# Patient Record
Sex: Female | Born: 1985
Health system: Southern US, Community
[De-identification: ages and names within clinical notes are randomized; demographics above are authoritative.]

## PROBLEM LIST (undated history)

## (undated) ENCOUNTER — Inpatient Hospital Stay (HOSPITAL_COMMUNITY): Payer: Self-pay

## (undated) DIAGNOSIS — G2581 Restless legs syndrome: Secondary | ICD-10-CM

## (undated) DIAGNOSIS — K219 Gastro-esophageal reflux disease without esophagitis: Secondary | ICD-10-CM

## (undated) DIAGNOSIS — T7840XA Allergy, unspecified, initial encounter: Secondary | ICD-10-CM

## (undated) DIAGNOSIS — N189 Chronic kidney disease, unspecified: Secondary | ICD-10-CM

## (undated) DIAGNOSIS — F419 Anxiety disorder, unspecified: Secondary | ICD-10-CM

## (undated) DIAGNOSIS — M25569 Pain in unspecified knee: Secondary | ICD-10-CM

## (undated) DIAGNOSIS — M549 Dorsalgia, unspecified: Secondary | ICD-10-CM

## (undated) DIAGNOSIS — I8390 Asymptomatic varicose veins of unspecified lower extremity: Secondary | ICD-10-CM

## (undated) DIAGNOSIS — M1990 Unspecified osteoarthritis, unspecified site: Secondary | ICD-10-CM

## (undated) DIAGNOSIS — O169 Unspecified maternal hypertension, unspecified trimester: Secondary | ICD-10-CM

## (undated) DIAGNOSIS — E669 Obesity, unspecified: Secondary | ICD-10-CM

## (undated) DIAGNOSIS — R600 Localized edema: Secondary | ICD-10-CM

## (undated) HISTORY — DX: Restless legs syndrome: G25.81

## (undated) HISTORY — DX: Localized edema: R60.0

## (undated) HISTORY — PX: ADENOIDECTOMY: SUR15

## (undated) HISTORY — DX: Obesity, unspecified: E66.9

## (undated) HISTORY — PX: TONSILLECTOMY: SUR1361

## (undated) HISTORY — DX: Gastro-esophageal reflux disease without esophagitis: K21.9

## (undated) HISTORY — DX: Unspecified maternal hypertension, unspecified trimester: O16.9

## (undated) HISTORY — DX: Asymptomatic varicose veins of unspecified lower extremity: I83.90

## (undated) HISTORY — PX: KNEE ARTHROSCOPY: SUR90

## (undated) HISTORY — DX: Pain in unspecified knee: M25.569

## (undated) HISTORY — DX: Anxiety disorder, unspecified: F41.9

## (undated) HISTORY — DX: Allergy, unspecified, initial encounter: T78.40XA

## (undated) HISTORY — DX: Chronic kidney disease, unspecified: N18.9

## (undated) HISTORY — DX: Dorsalgia, unspecified: M54.9

## (undated) HISTORY — PX: MIDDLE EAR SURGERY: SHX713

---
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
## 2005-01-29 ENCOUNTER — Other Ambulatory Visit: Admission: RE | Admit: 2005-01-29 | Discharge: 2005-01-29 | Payer: Self-pay | Admitting: Obstetrics & Gynecology

## 2005-05-21 ENCOUNTER — Ambulatory Visit (HOSPITAL_BASED_OUTPATIENT_CLINIC_OR_DEPARTMENT_OTHER): Admission: RE | Admit: 2005-05-21 | Discharge: 2005-05-21 | Payer: Self-pay | Admitting: Specialist

## 2005-06-26 ENCOUNTER — Inpatient Hospital Stay (HOSPITAL_COMMUNITY): Admission: AD | Admit: 2005-06-26 | Discharge: 2005-06-28 | Payer: Self-pay | Admitting: Family Medicine

## 2005-11-10 ENCOUNTER — Emergency Department (HOSPITAL_COMMUNITY): Admission: EM | Admit: 2005-11-10 | Discharge: 2005-11-11 | Payer: Self-pay | Admitting: Emergency Medicine

## 2005-12-09 ENCOUNTER — Ambulatory Visit (HOSPITAL_COMMUNITY): Admission: RE | Admit: 2005-12-09 | Discharge: 2005-12-09 | Payer: Self-pay | Admitting: Otolaryngology

## 2006-02-27 ENCOUNTER — Encounter (HOSPITAL_COMMUNITY): Admission: RE | Admit: 2006-02-27 | Discharge: 2006-03-29 | Payer: Self-pay | Admitting: Endocrinology

## 2006-06-05 ENCOUNTER — Ambulatory Visit (HOSPITAL_COMMUNITY): Admission: RE | Admit: 2006-06-05 | Discharge: 2006-06-05 | Payer: Self-pay | Admitting: Urology

## 2008-07-09 ENCOUNTER — Emergency Department (HOSPITAL_COMMUNITY): Admission: EM | Admit: 2008-07-09 | Discharge: 2008-07-09 | Payer: Self-pay | Admitting: Emergency Medicine

## 2010-06-03 NOTE — L&D Delivery Note (Signed)
Delivery Note At 3:29 PM a viable female was delivered via Vaginal, Spontaneous Delivery (Presentation: ; Occiput Anterior).  APGAR: 9, 9; weight 7 lb 10.1 oz (3460 g).   Placenta status: Intact, Spontaneous.  Cord: 3 vessels with the following complications: None.  Cord pH: not obtained  Anesthesia: Epidural  Episiotomy: none Lacerations: tear of vaginal septum and second degree laceration - septum  Suture Repair: 2.0 3.0 chromic vicryl Est. Blood Loss (mL):   Mom to postpartum.  Baby to nursery-stable.  Elizabeth Foster L 05/09/2011, 3:50 PM

## 2010-10-09 LAB — ABO/RH: RH Type: POSITIVE

## 2010-10-09 LAB — ANTIBODY SCREEN: Antibody Screen: NEGATIVE

## 2010-10-09 LAB — RUBELLA ANTIBODY, IGM: Rubella: IMMUNE

## 2010-10-09 LAB — HIV ANTIBODY (ROUTINE TESTING W REFLEX): HIV: NONREACTIVE

## 2010-10-09 LAB — HEPATITIS B SURFACE ANTIGEN: Hepatitis B Surface Ag: NEGATIVE

## 2010-10-14 ENCOUNTER — Inpatient Hospital Stay (HOSPITAL_COMMUNITY): Payer: 59

## 2010-10-14 ENCOUNTER — Inpatient Hospital Stay (HOSPITAL_COMMUNITY)
Admission: AD | Admit: 2010-10-14 | Discharge: 2010-10-14 | Disposition: A | Discharge status: Home / Self Care | Payer: 59 | Source: Ambulatory Visit | Attending: Obstetrics and Gynecology | Admitting: Obstetrics and Gynecology

## 2010-10-14 DIAGNOSIS — O200 Threatened abortion: Secondary | ICD-10-CM

## 2010-10-14 LAB — URINE MICROSCOPIC-ADD ON

## 2010-10-14 LAB — ABO/RH: ABO/RH(D): A POS

## 2010-10-14 LAB — URINALYSIS, ROUTINE W REFLEX MICROSCOPIC
Glucose, UA: NEGATIVE mg/dL
Ketones, ur: NEGATIVE mg/dL
Protein, ur: NEGATIVE mg/dL
Specific Gravity, Urine: 1.01 (ref 1.005–1.030)
pH: 5.5 (ref 5.0–8.0)

## 2010-10-14 LAB — CBC
Hemoglobin: 13.7 g/dL (ref 12.0–15.0)
MCHC: 35.7 g/dL (ref 30.0–36.0)
MCV: 87.3 fL (ref 78.0–100.0)

## 2010-10-14 LAB — HCG, QUANTITATIVE, PREGNANCY: hCG, Beta Chain, Quant, S: 96262 m[IU]/mL — ABNORMAL HIGH (ref <5)

## 2010-10-15 LAB — GC/CHLAMYDIA PROBE AMP, GENITAL: GC Probe Amp, Genital: NEGATIVE

## 2010-10-19 NOTE — Op Note (Signed)
Elizabeth, Foster           ACCOUNT NO.:  [id number]   MEDICAL RECORD NO.:  [id number]          PATIENT TYPE:  AMB   LOCATION:  DSC                          FACILITY:  [facility]   PHYSICIAN:  Erasmo Leventhal, M.D.DATE OF BIRTH:  Feb 10, 1986   DATE OF PROCEDURE:  05/21/2005  DATE OF DISCHARGE:                                 OPERATIVE REPORT   PREOPERATIVE DIAGNOSIS:  Right knee probable recurrent lateral meniscal  tear.   POSTOPERATIVE DIAGNOSES:  1.  Right knee probable recurrent lateral meniscal tear.  2.  Synovitis.  3.  Grade 2 chondromalacia of the patella.   PROCEDURE:  1.  Right knee arthroscopic partial lateral meniscectomy.  2.  Chondroplasty of patella.  3.  Synovectomy.   SURGEON:  Erasmo Leventhal, M.D.   ASSISTANT:  Jaquelyn Bitter. Chabon, P.A.-C   ANESTHESIA:  General followed by intraoperative knee block.   ESTIMATED BLOOD LOSS:  Less than 5 mL.   DRAINS:  None.   COMPLICATIONS:  None.   TOURNIQUET TIME:  25 minutes at 300 mmHg.   DISPOSITION:  To PACU stable.   OPERATIVE DETAILS:  The patient's family was counseled in the holding area.  Patient was identified, IV was started and antibiotics were given.  The  patient refused a knee block at this time.  He was taken to the OR room,  placed in usual position, under general anesthesia.  The right lower  extremity was placed in a thigh holder, prepped with DuraPrep and draped in  a sterile fashion and exsanguinated with Esmarch inflated to 300 mmHg.  Arthroscopic portals were established:  Proximal medial, anteromedial, and  anterolateral.  Diagnostic arthroscopy was then undertaken.   She had undergone a previous lateral release and patellofemoral tracking was  normal, but there was some grade 2 chondromalacia in the fossa on the  undersurface of the patella with some unstable flaps and mechanical  chondroplasty was performed about the stable base.  The __________  was  unremarkable.  ACL and  PCL were intact.  Medial gutter was unremarkable.  Medial compartment was inspected with normal articular cartilage.  Medial  meniscus lateral side was inspected with normal articular cartilage.  She  had undergone partial medial meniscectomy in the past for discoid lateral  meniscus.  I probed posteriorly to make sure that she did not have a type 3  or unstable lateral meniscus, and she did not, but there were radial tears  on the inner one-third.  Utilizing the basket a very small amount of lateral  meniscal tissue was smoothed back and saucerized lateral meniscus to a nice  smooth edge and then took arthro __________ specimen and gently coagulated  the peripheral meniscus to stabilize it being very careful to touch the  meniscus only, and not the articular cartilage.  There was also some  synovitis in the intercondylar notch which appeared to be like it was  impinging on the lateral compartment.  It was shaved down and electrocautery  was used to coagulate the periphery and also for good hemostasis.   The knee was then sequentially reinspected.  There were no  other  abnormalities noted.  The knee was irrigated and all instruments were  removed.  The three portals were closed with 4-0 Nylon suture.  For further  anesthesia 30 mL of 1/2% Marcaine with epinephrine with an additional 4 mg  of morphine sulfate was injected into the knee for postoperative pain, and  hemostasis as a knee block.  Sterile dressing was applied to the knee.  Tourniquet was deflated, normal circulation in the foot and ankle at the end  of the case.  TED hose and ice pack was applied in full extension. She  tolerated the procedure well.  There were no complications, and she was  gently taken to the recovery room in stable condition.  Sponge and needle  counts were correct.  No complications or problems.           ______________________________  Erasmo Leventhal, M.D.     RAC/MEDQ  D:  05/21/2005  T:   05/22/2005  Job:  [id number]   cc:   [facility]

## 2010-10-19 NOTE — H&P (Signed)
Elizabeth Foster, Elizabeth Foster           ACCOUNT NO.:  [id number]   MEDICAL RECORD NO.:  [id number]          PATIENT TYPE:  INP   LOCATION:  A321                          FACILITY:  [facility]   PHYSICIAN:  Scott A. Gerda Diss, MD    DATE OF BIRTH:  [birth year]   DATE OF ADMISSION:  06/26/2005  DATE OF DISCHARGE:  LH                                HISTORY & PHYSICAL   CHIEF COMPLAINT:  Right flank pain discomfort, abdominal pain, dysuria.   HISTORY OF PRESENT ILLNESS:  A 25 year old white female who was seen just  yesterday with dysuria off and on for a month, lower abdominal discomfort, a  little bit of chills but no high fevers, no vomiting, was treated with a  shot of Rocephin and was instructed to follow up on January 24.  No  vomiting.  Was unable to take much in because of nausea.  Increased pain  that woke her up much of the time at night.  The patient denied any  hematuria, denied diarrhea.  No respiratory symptoms.   PAST MEDICAL HISTORY:  The patient has normal overall health with a history  of gastritis and allergies and is on birth control pills.  She has had  recent knee surgery.  She has a history of reflux as well.   FAMILY HISTORY:  Noncontributory.   PHYSICAL EXAMINATION:  GENERAL: No acute distress.  HEENT:  TMs within normal limits.  NECK: Trach within normal limits.  CHEST: Within normal limits.  HEART: Regular.  ABDOMEN:  Mild right percussion tenderness and abdominal tenderness.  No  guarding or rebound.   MEDICATIONS:  1.  Protonix 40 mg daily.  2.  Allegra 60 mg twice daily.  3.  Yasmin daily.   LABORATORY DATA:  White count 9.8, hemoglobin 13.8, hematocrit 30.5.  Sodium  138, potassium 3.3, glucose 103, BUN 5, creatinine 0.8.  ALT and AST normal.  Beta HCG less than 2.   CT scan pending.   ASSESSMENT AND PLAN:  Pyelonephritis: Because of the ongoing course over the  past several weeks, we will go ahead with CT scan to make sure there is not  something else going  on.  We will cover her with Levaquin and Rocephin and  monitor the patient closely.  Expect the patient to gradually improve,  possibly be able to be discharged within the next few days, but that all  depends on outcome of these tests and how she responds.      Scott A. Gerda Diss, MD  Electronically Signed     SAL/MEDQ  D:  06/26/2005  T:  06/26/2005  Job:  [id number]

## 2010-10-19 NOTE — Discharge Summary (Signed)
NAMERYLEN, HOU           ACCOUNT NO.:  [id number]   MEDICAL RECORD NO.:  [id number]          PATIENT TYPE:  INP   LOCATION:  A321                          FACILITY:  [facility]   PHYSICIAN:  Scott A. Gerda Diss, MD    DATE OF BIRTH:  [birth year]   DATE OF ADMISSION:  06/26/2005  DATE OF DISCHARGE:  01/26/2007LH                                 DISCHARGE SUMMARY   DISCHARGE DIAGNOSIS:  Pyelonephritis.   HOSPITAL COURSE:  This patient was admitted in after failing outpatient  therapy with antibiotics.  She was having right flank pain discomfort,  abdominal pain with dysuria.  We did a CT scan while in the hospital, did  not have an abscess.  The patient responded to the IV antibiotics, fluids  and pain medication and gradually over the next couple of days had less pain  and discomfort.  On the evening of January 25, was much improved.  In the  morning, we felt she was stable enough to be sent home.  She was sent home  on Levaquin, fluid intake and instructed to follow up in 4-5 days after  hospitalization.  Her main aspects after being admitted into the hospital  were severe pain discomfort despite outpatient treatment along with nausea  and vomiting.  It should be noted that her white count on admission was 9.8,  but clinically, the patient was in severe pain, inability to keep things  down, had potassium of 3.3.  It was felt that the wiser thing to do was to  treat her with IV fluids and antibiotics.      Scott A. Gerda Diss, MD  Electronically Signed     SAL/MEDQ  D:  07/23/2005  T:  07/23/2005  Job:  [id number]

## 2011-03-23 ENCOUNTER — Inpatient Hospital Stay (HOSPITAL_COMMUNITY)
Admission: AD | Admit: 2011-03-23 | Discharge: 2011-03-23 | Disposition: A | Discharge status: Home / Self Care | Payer: 59 | Source: Ambulatory Visit | Attending: Obstetrics and Gynecology | Admitting: Obstetrics and Gynecology

## 2011-03-23 ENCOUNTER — Encounter (HOSPITAL_COMMUNITY): Payer: Self-pay

## 2011-03-23 DIAGNOSIS — R109 Unspecified abdominal pain: Secondary | ICD-10-CM | POA: Insufficient documentation

## 2011-03-23 DIAGNOSIS — O99891 Other specified diseases and conditions complicating pregnancy: Secondary | ICD-10-CM | POA: Insufficient documentation

## 2011-03-23 DIAGNOSIS — N949 Unspecified condition associated with female genital organs and menstrual cycle: Secondary | ICD-10-CM

## 2011-03-23 LAB — URINE MICROSCOPIC-ADD ON

## 2011-03-23 LAB — URINALYSIS, ROUTINE W REFLEX MICROSCOPIC
Ketones, ur: NEGATIVE mg/dL
Nitrite: NEGATIVE
Urobilinogen, UA: 0.2 mg/dL (ref 0.0–1.0)

## 2011-03-23 NOTE — Progress Notes (Signed)
Onset of cramping since 2:00 p.m. With some tightening with back pain, had SCH at 10 weeks.

## 2011-03-23 NOTE — ED Provider Notes (Signed)
Elizabeth Foster 89 y.o.G1P0 @[redacted]w[redacted]d  Chief Complaint  Patient presents with  . Abdominal Cramping    SUBJECTIVE  HPI:  She reports intermittent upper abdominal pain which she thought were Elizabeth Foster beginning at 2 PM today. She describes it as "balling up"  Bu tnot does not attribute it to fetal movement .She's not having upper abdominal pain now. She also has been having continuous lower abdominal cramping which is now somewhat better at rest but still present. The cramping radiates to low back. Good fetal movement. No leakage of fluid or vaginal bleeding. Denies dysuria, hematuria, changing frequency of urination, urgency, fever/chills. Denies any abnormal discharge, irritation or itching of vulva or vagina. 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 She notes the discomfort is a little worse with walking.  History reviewed. No pertinent past medical history.   Gyn Hx: negative for STI's Past Surgical History  Procedure Date  . Knee arthroscopy     x2  . Tonsillectomy   . Adenoidectomy    History   Social History  . Marital Status: Married    Spouse Name: N/A    Number of Children: N/A  . Years of Education: N/A   Occupational History  . Not on file.   Social History Main Topics  . Smoking status: Never Smoker   . Smokeless tobacco: Not on file  . Alcohol Use: No  . Drug Use: No  . Sexually Active: Yes   Other Topics Concern  . Not on file   Social History Narrative  . No narrative on file   No current facility-administered medications on file prior to encounter.   No current outpatient prescriptions on file prior to encounter.   Allergies  Allergen Reactions  . Amoxicillin Rash    ROS: Pertinent items in HPI  OBJECTIVE  BP 124/78  Pulse 79  Temp(Src) 98.8 F (37.1 C) (Oral)  Resp 18  Ht 5\' 4"  (1.626 m)  Wt 91.354 kg (201 lb 6.4 oz)  BMI 34.57 kg/m2   Physical Exam:  General: WN/WD in NAD JXB:JYNW, nontender, size consistent with dates.  No  palpable UCs. VE: there is a vaginal septum. Discharge is physiologic. Cervix is long closed high. Back: neg CVAT Ext: neg edema  Toco: No UCs, occ sl UI rare low amplitude UC on 1 hour of EFM FHR 135 reactive  Results for orders placed during the hospital encounter of 03/23/11 (from the past 24 hour(s))  URINALYSIS, ROUTINE W REFLEX MICROSCOPIC     Status: Abnormal   Collection Time   03/23/11  6:15 PM      Component Value Range   Color, Urine YELLOW  YELLOW    Appearance CLEAR  CLEAR    Specific Gravity, Urine <1.005 (*) 1.005 - 1.030    pH 6.0  5.0 - 8.0    Glucose, UA NEGATIVE  NEGATIVE (mg/dL)   Hgb urine dipstick NEGATIVE  NEGATIVE    Bilirubin Urine NEGATIVE  NEGATIVE    Ketones, ur NEGATIVE  NEGATIVE (mg/dL)   Protein, ur NEGATIVE  NEGATIVE (mg/dL)   Urobilinogen, UA 0.2  0.0 - 1.0 (mg/dL)   Nitrite NEGATIVE  NEGATIVE    Leukocytes, UA SMALL (*) NEGATIVE   URINE MICROSCOPIC-ADD ON     Status: Abnormal   Collection Time   03/23/11  6:15 PM      Component Value Range   Squamous Epithelial / LPF FEW (*) RARE    WBC, UA 0-2  <3 (WBC/hpf)  RBC / HPF 0-2  <3 (RBC/hpf)   Bacteria, UA RARE  RARE    ASSESSMENT  G1 at 33 wks with abd discomfort without evidence for PTL , UTI or dehydration. Suspect musculoskeletal origin.   PLAN C/W Dr. Rana Snare. Work excusex 2 days and call office Mon for recheck if still symptomatic. Round ligament pain handout reviewed May use Tylenol

## 2011-04-16 LAB — STREP B DNA PROBE: GBS: NEGATIVE

## 2011-05-07 ENCOUNTER — Telehealth (HOSPITAL_COMMUNITY): Payer: Self-pay | Admitting: *Deleted

## 2011-05-07 ENCOUNTER — Encounter (HOSPITAL_COMMUNITY): Payer: Self-pay | Admitting: *Deleted

## 2011-05-08 ENCOUNTER — Encounter (HOSPITAL_COMMUNITY): Payer: Self-pay | Admitting: *Deleted

## 2011-05-08 ENCOUNTER — Telehealth (HOSPITAL_COMMUNITY): Payer: Self-pay | Admitting: *Deleted

## 2011-05-08 NOTE — Telephone Encounter (Signed)
Preadmission screen  

## 2011-05-09 ENCOUNTER — Encounter (HOSPITAL_COMMUNITY): Payer: Self-pay

## 2011-05-09 ENCOUNTER — Encounter (HOSPITAL_COMMUNITY): Payer: Self-pay | Admitting: Anesthesiology

## 2011-05-09 ENCOUNTER — Inpatient Hospital Stay (HOSPITAL_COMMUNITY): Payer: 59 | Admitting: Anesthesiology

## 2011-05-09 ENCOUNTER — Inpatient Hospital Stay (HOSPITAL_COMMUNITY)
Admission: RE | Admit: 2011-05-09 | Discharge: 2011-05-11 | DRG: 775 | Disposition: A | Discharge status: Home / Self Care | Payer: 59 | Source: Ambulatory Visit | Attending: Obstetrics and Gynecology | Admitting: Obstetrics and Gynecology

## 2011-05-09 DIAGNOSIS — O3460 Maternal care for abnormality of vagina, unspecified trimester: Secondary | ICD-10-CM | POA: Diagnosis present

## 2011-05-09 DIAGNOSIS — Q52129 Other and unspecified longitudinal vaginal septum: Secondary | ICD-10-CM

## 2011-05-09 DIAGNOSIS — O3660X0 Maternal care for excessive fetal growth, unspecified trimester, not applicable or unspecified: Secondary | ICD-10-CM | POA: Diagnosis present

## 2011-05-09 LAB — CBC
Hemoglobin: 13.5 g/dL (ref 12.0–15.0)
Platelets: 169 10*3/uL (ref 150–400)
RBC: 4.41 MIL/uL (ref 3.87–5.11)
WBC: 15 10*3/uL — ABNORMAL HIGH (ref 4.0–10.5)

## 2011-05-09 LAB — RPR: RPR Ser Ql: NONREACTIVE

## 2011-05-09 LAB — RUBELLA SCREEN: Rubella: 68.5 IU/mL — ABNORMAL HIGH

## 2011-05-09 MED ORDER — ZOLPIDEM TARTRATE 5 MG PO TABS: 5.0000 mg | ORAL_TABLET | Freq: Every evening | ORAL | Status: DC | PRN

## 2011-05-09 MED ORDER — LANOLIN HYDROUS EX OINT: TOPICAL_OINTMENT | CUTANEOUS | Status: DC | PRN

## 2011-05-09 MED ORDER — LIDOCAINE HCL 1.5 % IJ SOLN
INTRAMUSCULAR | Status: DC | PRN
  Administered 2011-05-09 (×2): 4 mL via EPIDURAL

## 2011-05-09 MED ORDER — TETANUS-DIPHTH-ACELL PERTUSSIS 5-2.5-18.5 LF-MCG/0.5 IM SUSP: 0.5000 mL | Freq: Once | INTRAMUSCULAR | Status: DC

## 2011-05-09 MED ORDER — BENZOCAINE-MENTHOL 20-0.5 % EX AERO
1.0000 "application " | INHALATION_SPRAY | CUTANEOUS | Status: DC | PRN
  Administered 2011-05-09 – 2011-05-10 (×2): 1 via TOPICAL

## 2011-05-09 MED ORDER — LACTATED RINGERS IV SOLN
INTRAVENOUS | Status: DC
  Administered 2011-05-09 (×2): via INTRAVENOUS

## 2011-05-09 MED ORDER — LACTATED RINGERS IV SOLN: 500.0000 mL | Freq: Once | INTRAVENOUS | Status: DC

## 2011-05-09 MED ORDER — EPHEDRINE 5 MG/ML INJ
10.0000 mg | INTRAVENOUS | Status: DC | PRN
  Filled 2011-05-09: qty 4

## 2011-05-09 MED ORDER — IBUPROFEN 600 MG PO TABS: 600.0000 mg | ORAL_TABLET | Freq: Four times a day (QID) | ORAL | Status: DC | PRN

## 2011-05-09 MED ORDER — SIMETHICONE 80 MG PO CHEW: 80.0000 mg | CHEWABLE_TABLET | ORAL | Status: DC | PRN

## 2011-05-09 MED ORDER — TERBUTALINE SULFATE 1 MG/ML IJ SOLN: 0.2500 mg | Freq: Once | INTRAMUSCULAR | Status: DC | PRN

## 2011-05-09 MED ORDER — PHENYLEPHRINE 40 MCG/ML (10ML) SYRINGE FOR IV PUSH (FOR BLOOD PRESSURE SUPPORT): 80.0000 ug | PREFILLED_SYRINGE | INTRAVENOUS | Status: DC | PRN

## 2011-05-09 MED ORDER — ACETAMINOPHEN 325 MG PO TABS: 650.0000 mg | ORAL_TABLET | ORAL | Status: DC | PRN

## 2011-05-09 MED ORDER — MEASLES, MUMPS & RUBELLA VAC ~~LOC~~ INJ: 0.5000 mL | INJECTION | Freq: Once | SUBCUTANEOUS | Status: DC

## 2011-05-09 MED ORDER — ONDANSETRON HCL 4 MG/2ML IJ SOLN: 4.0000 mg | Freq: Four times a day (QID) | INTRAMUSCULAR | Status: DC | PRN

## 2011-05-09 MED ORDER — LACTATED RINGERS IV SOLN: 500.0000 mL | INTRAVENOUS | Status: DC | PRN

## 2011-05-09 MED ORDER — FLEET ENEMA 7-19 GM/118ML RE ENEM: 1.0000 | ENEMA | Freq: Every day | RECTAL | Status: DC | PRN

## 2011-05-09 MED ORDER — DIBUCAINE 1 % RE OINT: 1.0000 "application " | TOPICAL_OINTMENT | RECTAL | Status: DC | PRN

## 2011-05-09 MED ORDER — MEDROXYPROGESTERONE ACETATE 150 MG/ML IM SUSP: 150.0000 mg | INTRAMUSCULAR | Status: DC | PRN

## 2011-05-09 MED ORDER — DIPHENHYDRAMINE HCL 25 MG PO CAPS: 25.0000 mg | ORAL_CAPSULE | Freq: Four times a day (QID) | ORAL | Status: DC | PRN

## 2011-05-09 MED ORDER — FENTANYL 2.5 MCG/ML BUPIVACAINE 1/10 % EPIDURAL INFUSION (WH - ANES)
INTRAMUSCULAR | Status: DC | PRN
  Administered 2011-05-09: 14 mL/h via EPIDURAL

## 2011-05-09 MED ORDER — LIDOCAINE HCL (PF) 1 % IJ SOLN
30.0000 mL | INTRAMUSCULAR | Status: DC | PRN
  Administered 2011-05-09: 30 mL via SUBCUTANEOUS
  Filled 2011-05-09: qty 30

## 2011-05-09 MED ORDER — OXYCODONE-ACETAMINOPHEN 5-325 MG PO TABS: 1.0000 | ORAL_TABLET | ORAL | Status: DC | PRN

## 2011-05-09 MED ORDER — FLEET ENEMA 7-19 GM/118ML RE ENEM: 1.0000 | ENEMA | RECTAL | Status: DC | PRN

## 2011-05-09 MED ORDER — OXYTOCIN 20 UNITS IN LACTATED RINGERS INFUSION - SIMPLE
1.0000 m[IU]/min | INTRAVENOUS | Status: DC
  Administered 2011-05-09: 2 m[IU]/min via INTRAVENOUS
  Administered 2011-05-09: 999 m[IU]/min via INTRAVENOUS

## 2011-05-09 MED ORDER — FENTANYL 2.5 MCG/ML BUPIVACAINE 1/10 % EPIDURAL INFUSION (WH - ANES)
14.0000 mL/h | INTRAMUSCULAR | Status: DC
  Administered 2011-05-09: 14 mL/h via EPIDURAL
  Filled 2011-05-09 (×2): qty 60

## 2011-05-09 MED ORDER — BISACODYL 10 MG RE SUPP: 10.0000 mg | Freq: Every day | RECTAL | Status: DC | PRN

## 2011-05-09 MED ORDER — BENZOCAINE-MENTHOL 20-0.5 % EX AERO
INHALATION_SPRAY | CUTANEOUS | Status: AC
  Administered 2011-05-09: 1 via TOPICAL
  Filled 2011-05-09: qty 56

## 2011-05-09 MED ORDER — IBUPROFEN 600 MG PO TABS
600.0000 mg | ORAL_TABLET | Freq: Four times a day (QID) | ORAL | Status: DC
  Administered 2011-05-09 – 2011-05-11 (×8): 600 mg via ORAL
  Filled 2011-05-09 (×8): qty 1

## 2011-05-09 MED ORDER — ONDANSETRON HCL 4 MG PO TABS: 4.0000 mg | ORAL_TABLET | ORAL | Status: DC | PRN

## 2011-05-09 MED ORDER — CITRIC ACID-SODIUM CITRATE 334-500 MG/5ML PO SOLN: 30.0000 mL | ORAL | Status: DC | PRN

## 2011-05-09 MED ORDER — OXYCODONE-ACETAMINOPHEN 5-325 MG PO TABS: 2.0000 | ORAL_TABLET | ORAL | Status: DC | PRN

## 2011-05-09 MED ORDER — SENNOSIDES-DOCUSATE SODIUM 8.6-50 MG PO TABS
2.0000 | ORAL_TABLET | Freq: Every day | ORAL | Status: DC
  Administered 2011-05-09 – 2011-05-10 (×2): 2 via ORAL

## 2011-05-09 MED ORDER — ONDANSETRON HCL 4 MG/2ML IJ SOLN: 4.0000 mg | INTRAMUSCULAR | Status: DC | PRN

## 2011-05-09 MED ORDER — WITCH HAZEL-GLYCERIN EX PADS: 1.0000 "application " | MEDICATED_PAD | CUTANEOUS | Status: DC | PRN

## 2011-05-09 MED ORDER — PHENYLEPHRINE 40 MCG/ML (10ML) SYRINGE FOR IV PUSH (FOR BLOOD PRESSURE SUPPORT)
80.0000 ug | PREFILLED_SYRINGE | INTRAVENOUS | Status: DC | PRN
  Filled 2011-05-09: qty 5

## 2011-05-09 MED ORDER — OXYTOCIN 20 UNITS IN LACTATED RINGERS INFUSION - SIMPLE
125.0000 mL/h | Freq: Once | INTRAVENOUS | Status: AC
  Administered 2011-05-09: 125 mL/h via INTRAVENOUS

## 2011-05-09 MED ORDER — DIPHENHYDRAMINE HCL 50 MG/ML IJ SOLN: 12.5000 mg | INTRAMUSCULAR | Status: DC | PRN

## 2011-05-09 MED ORDER — OXYTOCIN BOLUS FROM INFUSION
500.0000 mL | Freq: Once | INTRAVENOUS | Status: DC
  Filled 2011-05-09: qty 500
  Filled 2011-05-09: qty 1000

## 2011-05-09 MED ORDER — EPHEDRINE 5 MG/ML INJ: 10.0000 mg | INTRAVENOUS | Status: DC | PRN

## 2011-05-09 MED ORDER — PRENATAL PLUS 27-1 MG PO TABS
1.0000 | ORAL_TABLET | Freq: Every day | ORAL | Status: DC
  Administered 2011-05-10: 1 via ORAL
  Filled 2011-05-09 (×2): qty 1

## 2011-05-09 NOTE — Progress Notes (Signed)
Patient is feeling more contractions.  Afebrile Vital Signs stable Fetal heart rate ok Toco UCs every 3 minutes Cervix no change IUPC inserted Vaginal septum thick  IMPRESSION: IUP at 39 w 6 days Vaginal Septum LGA  PLAN: Continue pitocin Monitor labor curve

## 2011-05-09 NOTE — Anesthesia Preprocedure Evaluation (Signed)
Anesthesia Evaluation  Patient identified by MRN, date of birth, ID band Patient awake    Reviewed: Allergy & Precautions, H&P , Patient's Chart, lab work & pertinent test results  Airway Mallampati: III TM Distance: >3 FB Neck ROM: full    Dental No notable dental hx. (+) Teeth Intact   Pulmonary neg pulmonary ROS,  clear to auscultation  Pulmonary exam normal       Cardiovascular neg cardio ROS regular Normal    Neuro/Psych Negative Neurological ROS  Negative Psych ROS   GI/Hepatic negative GI ROS, Neg liver ROS, GERD-  Medicated and Controlled,  Endo/Other  Negative Endocrine ROS  Renal/GU negative Renal ROS  Genitourinary negative   Musculoskeletal   Abdominal Normal abdominal exam  (+)   Peds  Hematology negative hematology ROS (+)   Anesthesia Other Findings   Reproductive/Obstetrics (+) Pregnancy                           Anesthesia Physical Anesthesia Plan  ASA: II  Anesthesia Plan: Epidural   Post-op Pain Management:    Induction:   Airway Management Planned:   Additional Equipment:   Intra-op Plan:   Post-operative Plan:   Informed Consent: I have reviewed the patients History and Physical, chart, labs and discussed the procedure including the risks, benefits and alternatives for the proposed anesthesia with the patient or authorized representative who has indicated his/her understanding and acceptance.     Plan Discussed with: Anesthesiologist and Surgeon  Anesthesia Plan Comments:         Anesthesia Quick Evaluation

## 2011-05-09 NOTE — H&P (Signed)
25 year old Gravida 1 Para 0 at 39 week 6 days presents for induction per Dr. Rana Snare. She had ultrasound showing EFW 8 pound 8 oz. She also has a known vaginal septum. GBBS is negative Prenatal care see [place] form Afebrile Vital Signs Stable Fetal Heart rate is reactive Toco few contractions Lung CTAB Car RRR Abdomen is soft  Leopold 8 to 9 pounds Cervix 70%/tight 2 cm /-2 Vertex Very thick vaginal septum does not slide to the side easily  IMPRESSION: IUP at 39 w 6 day LGA Vaginal septum  PLAN: Pitocin  Epidural Follow labor curve closely

## 2011-05-09 NOTE — Anesthesia Procedure Notes (Signed)
Epidural Patient location during procedure: OB Start time: 05/09/2011 10:51 AM  Staffing Anesthesiologist: Ariam Mol A. Performed by: anesthesiologist   Preanesthetic Checklist Completed: patient identified, site marked, surgical consent, pre-op evaluation, timeout performed, IV checked, risks and benefits discussed and monitors and equipment checked  Epidural Patient position: sitting Prep: site prepped and draped and DuraPrep Patient monitoring: continuous pulse ox and blood pressure Approach: midline Injection technique: LOR air  Needle:  Needle type: Tuohy  Needle gauge: 17 G Needle length: 9 cm Needle insertion depth: 5 cm cm Catheter type: closed end flexible Catheter size: 19 Gauge Catheter at skin depth: 10 cm Test dose: negative and 1.5% lidocaine  Assessment Events: blood not aspirated, injection not painful, no injection resistance, negative IV test and no paresthesia  Additional Notes Patient is more comfortable after epidural dosed. Please see RN's note for documentation of vital signs and FHR which are stable.

## 2011-05-09 NOTE — Progress Notes (Signed)
Patient resting with epidural. Fetal heart rate is good IUPC very adequate by [place] units Cervix 70%/3 cm/-1 Vertex in transverse position Septum still quite thick  IMPRESSION: IUP at term LGA Vaginal septum  Plan: Recheck in 2 hours  Continue to follow labor curve

## 2011-05-10 LAB — CBC
HCT: 32.4 % — ABNORMAL LOW (ref 36.0–46.0)
Hemoglobin: 11 g/dL — ABNORMAL LOW (ref 12.0–15.0)
MCH: 30.1 pg (ref 26.0–34.0)
MCHC: 34 g/dL (ref 30.0–36.0)
MCV: 88.8 fL (ref 78.0–100.0)
RBC: 3.65 MIL/uL — ABNORMAL LOW (ref 3.87–5.11)

## 2011-05-10 MED ORDER — BENZOCAINE-MENTHOL 20-0.5 % EX AERO
INHALATION_SPRAY | CUTANEOUS | Status: AC
  Filled 2011-05-10: qty 56

## 2011-05-10 NOTE — Progress Notes (Signed)
Post Partum Day 1 Subjective: no complaints  Objective: Blood pressure 107/71, pulse 83, temperature 98.4 F (36.9 C), temperature source Oral, resp. rate 18, height 5\' 4"  (1.626 m), weight 95.255 kg (210 lb), SpO2 98.00%, unknown if currently breastfeeding.  Physical Exam:  General: alert and cooperative Lochia: appropriate Uterine Fundus: firm Perineum intact DVT Evaluation: No evidence of DVT seen on physical exam.   Basename 05/10/11 0535 05/09/11 0720  HGB 11.0* 13.5  HCT 32.4* 38.6    Assessment/Plan: Plan for discharge tomorrow   LOS: 1 day   Matalie Romberger G 05/10/2011, 8:04 AM

## 2011-05-10 NOTE — Anesthesia Postprocedure Evaluation (Signed)
Anesthesia Post Note  Patient: Elizabeth Foster  Procedure(s) Performed: * No procedures listed *  Anesthesia type: Epidural  Patient location: Mother/Baby  Post pain: Pain level controlled  Post assessment: Post-op Vital signs reviewed  Last Vitals:  Filed Vitals:   05/10/11 0645  BP: 107/71  Pulse: 83  Temp: 36.9 C  Resp: 18    Post vital signs: Reviewed  Level of consciousness: awake  Complications: No apparent anesthesia complications

## 2011-05-11 NOTE — Discharge Summary (Signed)
Obstetric Discharge Summary Reason for Admission: induction of labor Prenatal Procedures: none Intrapartum Procedures: spontaneous vaginal delivery and repair of vaginal septum tear Postpartum Procedures: none Complications-Operative and Postpartum: none Hemoglobin  Date Value Range Status  05/10/2011 11.0* 12.0-15.0 (g/dL) Final     DELTA CHECK NOTED     REPEATED TO VERIFY     HCT  Date Value Range Status  05/10/2011 32.4* 36.0-46.0 (%) Final    Discharge Diagnoses: Term Pregnancy-delivered  Discharge Information: Date: 05/11/2011 Activity: pelvic rest Diet: routine Medications: Ibuprofen Condition: stable Instructions: refer to practice specific booklet Discharge to: home Follow-up Information    Make an appointment in 6 weeks to follow up. (4-6wks)          Newborn Data: Live born female  Birth Weight: 7 lb 10.1 oz (3460 g) APGAR: 9, 9  To be evaluated by peds this morning .  Elizabeth Foster 05/11/2011, 7:55 AM

## 2011-05-11 NOTE — Progress Notes (Signed)
Post Partum Day 2 Subjective: no complaints, up ad lib and tolerating PO  Objective: Blood pressure 114/81, pulse 79, temperature 98 F (36.7 C), temperature source Oral, resp. rate 20, height 5\' 4"  (1.626 m), weight 95.255 kg (210 lb), SpO2 98.00%, unknown if currently breastfeeding.  Physical Exam:  General: alert and cooperative Lochia: appropriate Uterine Fundus: firm DVT Evaluation: No evidence of DVT seen on physical exam.   Basename 05/10/11 0535 05/09/11 0720  HGB 11.0* 13.5  HCT 32.4* 38.6    Assessment/Plan: Discharge home   LOS: 2 days   Marina Boerner 05/11/2011, 7:51 AM

## 2011-05-13 ENCOUNTER — Inpatient Hospital Stay (HOSPITAL_COMMUNITY): Admission: RE | Admit: 2011-05-13 | Payer: Commercial Managed Care - PPO | Source: Ambulatory Visit

## 2011-05-13 NOTE — Telephone Encounter (Signed)
Preadmission screen  

## 2013-06-03 NOTE — L&D Delivery Note (Signed)
Delivery Note At 12:09 PM a viable female was delivered via OA Presentation Apgars 9 9 weight pending Placenta status: spontaneous with 3 vessel cord, .  Cord:  with the following complications:none .  Cord pH: not obtained  Anesthesia: Epidural  Episiotomy: none Lacerations: 2nd Suture Repair: 3.0 chromic Est. Blood Loss (mL): 300  Mom to postpartum.  Baby to Couplet care / Skin to Skin.  Marytza Grandpre L 01/30/2014, 12:20 PM

## 2013-07-01 LAB — OB RESULTS CONSOLE ANTIBODY SCREEN: ANTIBODY SCREEN: NEGATIVE

## 2013-07-01 LAB — OB RESULTS CONSOLE ABO/RH: RH Type: POSITIVE

## 2013-07-01 LAB — OB RESULTS CONSOLE HIV ANTIBODY (ROUTINE TESTING): HIV: NONREACTIVE

## 2013-07-01 LAB — OB RESULTS CONSOLE RUBELLA ANTIBODY, IGM: Rubella: IMMUNE

## 2013-07-01 LAB — OB RESULTS CONSOLE RPR: RPR: NONREACTIVE

## 2013-07-01 LAB — OB RESULTS CONSOLE HEPATITIS B SURFACE ANTIGEN: HEP B S AG: NEGATIVE

## 2013-07-01 LAB — OB RESULTS CONSOLE GC/CHLAMYDIA
CHLAMYDIA, DNA PROBE: NEGATIVE
Gonorrhea: NEGATIVE

## 2013-07-17 ENCOUNTER — Encounter (HOSPITAL_COMMUNITY): Payer: Self-pay

## 2013-07-17 ENCOUNTER — Inpatient Hospital Stay (HOSPITAL_COMMUNITY): Payer: 59

## 2013-07-17 ENCOUNTER — Inpatient Hospital Stay (HOSPITAL_COMMUNITY)
Admission: AD | Admit: 2013-07-17 | Discharge: 2013-07-17 | Disposition: A | Discharge status: Home / Self Care | Payer: 59 | Source: Ambulatory Visit | Attending: Obstetrics and Gynecology | Admitting: Obstetrics and Gynecology

## 2013-07-17 DIAGNOSIS — O26839 Pregnancy related renal disease, unspecified trimester: Secondary | ICD-10-CM | POA: Insufficient documentation

## 2013-07-17 DIAGNOSIS — R109 Unspecified abdominal pain: Secondary | ICD-10-CM | POA: Insufficient documentation

## 2013-07-17 DIAGNOSIS — N189 Chronic kidney disease, unspecified: Secondary | ICD-10-CM | POA: Insufficient documentation

## 2013-07-17 DIAGNOSIS — O209 Hemorrhage in early pregnancy, unspecified: Secondary | ICD-10-CM

## 2013-07-17 DIAGNOSIS — O208 Other hemorrhage in early pregnancy: Secondary | ICD-10-CM | POA: Insufficient documentation

## 2013-07-17 LAB — URINALYSIS, ROUTINE W REFLEX MICROSCOPIC
BILIRUBIN URINE: NEGATIVE
Glucose, UA: NEGATIVE mg/dL
Hgb urine dipstick: NEGATIVE
Ketones, ur: NEGATIVE mg/dL
Leukocytes, UA: NEGATIVE
NITRITE: NEGATIVE
Protein, ur: NEGATIVE mg/dL
SPECIFIC GRAVITY, URINE: 1.01 (ref 1.005–1.030)
UROBILINOGEN UA: 0.2 mg/dL (ref 0.0–1.0)
pH: 6 (ref 5.0–8.0)

## 2013-07-17 NOTE — MAU Note (Signed)
Pt states here for bleeding that began this am around 1030. Initially was a larger amount, with 2 small clots noted. Since then will be sporadic, sometimes no blood, sometimes light pink/brown. Having lower abd pain moreso in middle.

## 2013-07-17 NOTE — MAU Provider Note (Signed)
History     CSN: [id number]  Arrival date and time: 07/17/13 1426   First Provider Initiated Contact with Patient 07/17/13 1549      No chief complaint on file.  HPI Elizabeth Foster is a 28 y.o. G2P1001 [redacted]w[redacted]d. She had bright red gush of blood this am, passed 2 pea sized clots. Has low ML cramps, that is not new. No recent changes in discharge, odor or itching. No UTI S&S. They had intercourse this am.    Past Medical History  Diagnosis Date  . Chronic kidney disease     chronic uti and kidney infections    Past Surgical History  Procedure Laterality Date  . Knee arthroscopy      x2  . Tonsillectomy    . Adenoidectomy    . Middle ear surgery      benign growth removed from eardrum    Family History  Problem Relation Age of Onset  . Anemia Mother   . Thyroid disease Mother   . COPD Maternal Grandmother   . Thyroid disease Maternal Grandmother   . COPD Maternal Grandfather   . Heart disease Paternal Grandfather   . Diabetes Paternal Grandfather   . Anesthesia problems Neg Hx   . Hypotension Neg Hx   . Malignant hyperthermia Neg Hx   . Pseudochol deficiency Neg Hx     History  Substance Use Topics  . Smoking status: Never Smoker   . Smokeless tobacco: Never Used  . Alcohol Use: No    Allergies:  Allergies  Allergen Reactions  . Amoxicillin Rash    Prescriptions prior to admission  Medication Sig Dispense Refill  . CRANBERRY-VITAMIN C PO Take 1 tablet by mouth daily. Hold while in hospital.       . Doxylamine-Pyridoxine (DICLEGIS) 10-10 MG TBEC Take 2 tablets by mouth daily.      . prenatal vitamin w/FE, FA (PRENATAL 1 + 1) 27-1 MG TABS Take 1 tablet by mouth daily.          Review of Systems  Constitutional: Negative for fever and chills.  Gastrointestinal: Negative for nausea, vomiting, diarrhea and constipation.  Genitourinary: Negative for dysuria, urgency and frequency.       Bleeding   Physical Exam   Last menstrual period 05/02/2013, not  currently breastfeeding.  Physical Exam  Constitutional: She is oriented to person, place, and time. She appears well-developed and well-nourished.  GI: Soft. There is no tenderness.  Genitourinary:  Pelvic exam: Ext gen- nl anatomy,skin intact Vagina-scant tan mucoid discharge Cx-parous, closed Ut- enlarged, non tender Adn-non tender  Musculoskeletal: Normal range of motion.  Neurological: She is alert and oriented to person, place, and time.  Skin: Skin is warm and dry.  Psychiatric: She has a normal mood and affect. Her behavior is normal.    MAU Course  Procedures  MDM US Ob Comp Less 14 Wks  07/17/2013   CLINICAL DATA:  Bleeding  EXAM: OBSTETRIC <14 WK ULTRASOUND  TECHNIQUE: Transabdominal ultrasound was performed for evaluation of the gestation as well as the maternal uterus and adnexal regions.  COMPARISON:  Prior obstetrical ultrasound 10/14/2010  FINDINGS: Intrauterine gestational sac: Single normal intrauterine gestational sac identified  Yolk sac:  Present  Embryo:  Present  Cardiac Activity: Present  Heart Rate: 147 bpm  CRL:   45  mm   11 w 3 d                  [place] EDC: 02/02/2014  Maternal uterus/adnexae: Small subchorionic hemorrhage identified measuring approximately 2.7 x 0.6 cm. Bilateral ovaries are unremarkable. No free fluid.  IMPRESSION: Single viable intrauterine gestational sac with fetal heart rate of 147 beats per min. By crown-rump length, the gestational age is 66 weeks 3 days and the estimated date of confinement is February 02, 2014.  Small subchorionic hemorrhage.   Electronically Signed   By: Jacqulynn Cadet M.D.   On: 07/17/2013 17:06     Assessment and Plan  Bleeding late first trimester after intercourse Viable preg, small subchorionic hemorrhage  Pelvic rest Keep appt in office as scheduled Consulted with Dr Karie Mainland, [facility] M. 07/17/2013, 3:57 PM

## 2013-07-17 NOTE — Discharge Instructions (Signed)
Vaginal Bleeding During Pregnancy, First Trimester °A small amount of bleeding (spotting) from the vagina is relatively common in early pregnancy. It usually stops on its own. Various things may cause bleeding or spotting in early pregnancy. Some bleeding may be related to the pregnancy, and some may not. In most cases, the bleeding is normal and is not a problem. However, bleeding can also be a sign of something serious. Be sure to tell your health care provider about any vaginal bleeding right away. °Some possible causes of vaginal bleeding during the first trimester include: °· Infection or inflammation of the cervix. °· Growths (polyps) on the cervix. °· Miscarriage or threatened miscarriage. °· Pregnancy tissue has developed outside of the uterus and in a fallopian tube (tubal pregnancy). °· Tiny cysts have developed in the uterus instead of pregnancy tissue (molar pregnancy). °HOME CARE INSTRUCTIONS  °Watch your condition for any changes. The following actions may help to lessen any discomfort you are feeling: °· Follow your health care provider's instructions for limiting your activity. If your health care provider orders bed rest, you may need to stay in bed and only get up to use the bathroom. However, your health care provider may allow you to continue light activity. °· If needed, make plans for someone to help with your regular activities and responsibilities while you are on bed rest. °· Keep track of the number of pads you use each day, how often you change pads, and how soaked (saturated) they are. Write this down. °· Do not use tampons. Do not douche. °· Do not have sexual intercourse or orgasms until approved by your health care provider. °· If you pass any tissue from your vagina, save the tissue so you can show it to your health care provider. °· Only take over-the-counter or prescription medicines as directed by your health care provider. °· Do not take aspirin because it can make you  bleed. °· Keep all follow-up appointments as directed by your health care provider. °SEEK MEDICAL CARE IF: °· You have any vaginal bleeding during any part of your pregnancy. °· You have cramps or labor pains. °SEEK IMMEDIATE MEDICAL CARE IF:  °· You have severe cramps in your back or belly (abdomen). °· You have a fever, not controlled by medicine. °· You pass large clots or tissue from your vagina. °· Your bleeding increases. °· You feel lightheaded or weak, or you have fainting episodes. °· You have chills. °· You are leaking fluid or have a gush of fluid from your vagina. °· You pass out while having a bowel movement. °MAKE SURE YOU: °· Understand these instructions. °· Will watch your condition. °· Will get help right away if you are not doing well or get worse. °Document Released: 02/27/2005 Document Revised: 03/10/2013 Document Reviewed: 01/25/2013 °Exit[employer]® Patient Information ©2014 [employer]. ° °

## 2013-11-19 ENCOUNTER — Encounter (HOSPITAL_COMMUNITY): Payer: Self-pay | Admitting: *Deleted

## 2013-11-19 ENCOUNTER — Inpatient Hospital Stay (HOSPITAL_COMMUNITY)
Admission: AD | Admit: 2013-11-19 | Discharge: 2013-11-19 | Disposition: A | Discharge status: Home / Self Care | Payer: 59 | Source: Ambulatory Visit | Attending: Obstetrics & Gynecology | Admitting: Obstetrics & Gynecology

## 2013-11-19 DIAGNOSIS — O4690 Antepartum hemorrhage, unspecified, unspecified trimester: Secondary | ICD-10-CM | POA: Insufficient documentation

## 2013-11-19 DIAGNOSIS — O4693 Antepartum hemorrhage, unspecified, third trimester: Secondary | ICD-10-CM

## 2013-11-19 LAB — URINALYSIS, ROUTINE W REFLEX MICROSCOPIC
Bilirubin Urine: NEGATIVE
GLUCOSE, UA: NEGATIVE mg/dL
Ketones, ur: NEGATIVE mg/dL
LEUKOCYTES UA: NEGATIVE
Nitrite: NEGATIVE
PH: 7 (ref 5.0–8.0)
Protein, ur: NEGATIVE mg/dL
Specific Gravity, Urine: <1.005 — ABNORMAL LOW (ref 1.005–1.030)
Urobilinogen, UA: 0.2 mg/dL (ref 0.0–1.0)

## 2013-11-19 LAB — URINE MICROSCOPIC-ADD ON

## 2013-11-19 NOTE — MAU Provider Note (Signed)
History     CSN: [id number]  Arrival date and time: 11/19/13 0134   First Provider Initiated Contact with Patient 11/19/13 0204      Chief Complaint  Patient presents with  . Vaginal Bleeding  . Abdominal Pain   HPI Elizabeth Foster is a 28 y.o. G2P1001 at [redacted]w[redacted]d who presents to MAU today with complaint of vaginal bleeding. The patient had a small [place] at 11 weeks seen on [place], but denies vaginal bleeding since then. She states last intercourse was ~ 10 pm last night. She woke up to use the bathroom this morning and noted a small blood clot and some pink on the tissue with wiping. She notes mild lower abdominal cramping rated at 3/10 now. She states she Foster had some occasional tightening over the last few weeks, but was told in the office that this was most likely muscle pain. She denies LOF today. She reports good fetal movement.   OB History   Grav Para Term Preterm Abortions TAB SAB Ect Mult Living   2 1 1       1       Past Medical History  Diagnosis Date  . Chronic kidney disease     chronic uti and kidney infections    Past Surgical History  Procedure Laterality Date  . Knee arthroscopy      x2  . Tonsillectomy    . Adenoidectomy    . Middle ear surgery      benign growth removed from eardrum    Family History  Problem Relation Age of Onset  . Anemia Mother   . Thyroid disease Mother   . COPD Maternal Grandmother   . Thyroid disease Maternal Grandmother   . COPD Maternal Grandfather   . Heart disease Paternal Grandfather   . Diabetes Paternal Grandfather   . Anesthesia problems Neg Hx   . Hypotension Neg Hx   . Malignant hyperthermia Neg Hx   . Pseudochol deficiency Neg Hx     History  Substance Use Topics  . Smoking status: Never Smoker   . Smokeless tobacco: Never Used  . Alcohol Use: No    Allergies:  Allergies  Allergen Reactions  . Amoxicillin Rash    Prescriptions prior to admission  Medication Sig Dispense Refill  . CRANBERRY-VITAMIN C  PO Take 1 tablet by mouth daily. Hold while in hospital.       . prenatal vitamin w/FE, FA (PRENATAL 1 + 1) 27-1 MG TABS Take 1 tablet by mouth daily.        . Doxylamine-Pyridoxine (DICLEGIS) 10-10 MG TBEC Take 2 tablets by mouth daily.        Review of Systems  Constitutional: Negative for fever and malaise/fatigue.  Gastrointestinal: Positive for nausea, vomiting and abdominal pain. Negative for diarrhea and constipation.  Genitourinary: Negative for dysuria, urgency and frequency.       + vaginal bleeding Neg  - vaginal discharge, LOF   Physical Exam   Blood pressure 109/75, pulse 80, temperature 98.5 F (36.9 C), temperature source Oral, resp. rate 20, height 5\' 4"  (1.626 m), weight 216 lb (97.977 kg), last menstrual period 05/02/2013, SpO2 100.00%.  Physical Exam  Constitutional: She is oriented to person, place, and time. She appears well-developed and well-nourished. No distress.  HENT:  Head: Normocephalic and atraumatic.  Cardiovascular: Normal rate.   Respiratory: Effort normal.  GI: Soft. She exhibits no distension and no mass. There is no tenderness. There is no rebound and no  guarding.  Genitourinary: Uterus is enlarged (appropriate for GA). Cervix exhibits no motion tenderness, no discharge and no friability. No bleeding around the vagina. Vaginal discharge (scant mucus discharge noted) found.  Neurological: She is alert and oriented to person, place, and time.  Skin: Skin is warm and dry. No erythema.  Psychiatric: She Foster a normal mood and affect.   Results for orders placed during the hospital encounter of 11/19/13 (from the past 24 hour(s))  URINALYSIS, ROUTINE W REFLEX MICROSCOPIC     Status: Abnormal   Collection Time    11/19/13  1:44 AM      Result Value Ref Range   Color, Urine YELLOW  YELLOW   APPearance CLEAR  CLEAR   Specific Gravity, Urine <1.005 (*) 1.005 - 1.030   pH 7.0  5.0 - 8.0   Glucose, UA NEGATIVE  NEGATIVE mg/dL   Hgb urine dipstick  MODERATE (*) NEGATIVE   Bilirubin Urine NEGATIVE  NEGATIVE   Ketones, ur NEGATIVE  NEGATIVE mg/dL   Protein, ur NEGATIVE  NEGATIVE mg/dL   Urobilinogen, UA 0.2  0.0 - 1.0 mg/dL   Nitrite NEGATIVE  NEGATIVE   Leukocytes, UA NEGATIVE  NEGATIVE  URINE MICROSCOPIC-ADD ON     Status: None   Collection Time    11/19/13  1:44 AM      Result Value Ref Range   Squamous Epithelial / LPF RARE  RARE   WBC, UA 0-2  <3 WBC/hpf   RBC / HPF 3-6  <3 RBC/hpf   Bacteria, UA RARE  RARE   Fetal Monitoring: Baseline: 130 bpm, moderate variability, + accelerations, no decelerations Contractions: none  MAU Course  Procedures None  MDM UA today Discussed with Dr. Lynnette Caffey. [place] for discharge with bleeding precautions  Assessment and Plan  A: SIUP at [redacted]w[redacted]d Vaginal bleeding in pregnancy, third trimester  P: Discharge home Bleeding precautions discussed Pelvic rest advised Patient advised to follow-up with Dr. Corinna Capra as scheduled or sooner if symptoms should change or worsen Patient may return to MAU as needed or if her condition were to change or worsen   Elizabeth Has, PA-C  11/19/2013, 2:48 AM

## 2013-11-19 NOTE — MAU Note (Signed)
PT SAYS AT 1215AM-  SHE GOT UP TO GO TO B-ROOM -  PASSED  SMALL CLOT- RED-  .   WORE PANTY LINER IN -  PINK-  SMALL AMT.,  WHEN WIPED  IN MAU IN B-ROOM  SMALL AMT PINK.     SAW DR Corinna Capra ON WED-  ALL OK.   HAS FELT TIGHTNESS X  4 WEEKS-  TOLD DR.    HAD SEX  AT 10PM.     DENIES HSV AND MRSA.

## 2013-11-19 NOTE — Discharge Instructions (Signed)
Pelvic Rest Pelvic rest is sometimes recommended for women when:   The placenta is partially or completely covering the opening of the cervix (placenta previa).  There is bleeding between the uterine wall and the amniotic sac in the first trimester (subchorionic hemorrhage).  The cervix begins to open without labor starting (incompetent cervix, cervical insufficiency).  The labor is too early (preterm labor). HOME CARE INSTRUCTIONS  Do not have sexual intercourse, stimulation, or an orgasm.  Do not use tampons, douche, or put anything in the vagina.  Do not lift anything over 10 pounds (4.5 kg).  Avoid strenuous activity or straining your pelvic muscles. SEEK MEDICAL CARE IF:  You have any vaginal bleeding during pregnancy. Treat this as a potential emergency.  You have cramping pain felt low in the stomach (stronger than menstrual cramps).  You notice vaginal discharge (watery, mucus, or bloody).  You have a low, dull backache.  There are regular contractions or uterine tightening. SEEK IMMEDIATE MEDICAL CARE IF: You have vaginal bleeding and have placenta previa.  Document Released: 09/14/2010 Document Revised: 08/12/2011 Document Reviewed: 09/14/2010 [facility] Patient Information 2015 [place], [place]. This information is not intended to replace advice given to you by your health care provider. Make sure you discuss any questions you have with your health care provider.  Vaginal Bleeding During Pregnancy, Third Trimester A small amount of bleeding (spotting) from the vagina is relatively common in pregnancy. Various things can cause bleeding or spotting in pregnancy. Sometimes the bleeding is normal and is not a problem. However, bleeding during the third trimester can also be a sign of something serious for the mother and the baby. Be sure to tell your health care provider about any vaginal bleeding right away.  Some possible causes of vaginal bleeding during the third  trimester include:   The placenta may be partially or completely covering the opening to the cervix (placenta previa).   The placenta may have separated from the uterus (abruption of the placenta).   There may be an infection or growth on the cervix.   You may be starting labor, called discharging of the mucus plug.   The placenta may grow into the muscle layer of the uterus (placenta accreta).  HOME CARE INSTRUCTIONS  Watch your condition for any changes. The following actions may help to lessen any discomfort you are feeling:   Follow your health care provider's instructions for limiting your activity. If your health care provider orders bed rest, you may need to stay in bed and only get up to use the bathroom. However, your health care provider may allow you to continue light activity.  If needed, make plans for someone to help with your regular activities and responsibilities while you are on bed rest.  Keep track of the number of pads you use each day, how often you change pads, and how soaked (saturated) they are. Write this down.  Do not use tampons. Do not douche.  Do not have sexual intercourse or orgasms until approved by your health care provider.  Follow your health care provider's advice about lifting, driving, and physical activities.  If you pass any tissue from your vagina, save the tissue so you can show it to your health care provider.   Only take over-the-counter or prescription medicines as directed by your health care provider.  Do not take aspirin because it can make you bleed.   Keep all follow-up appointments as directed by your health care provider. SEEK MEDICAL CARE IF:  You have any vaginal bleeding during any part of your pregnancy.  You have cramps or labor pains.  You have a fever, not controlled by medicine. SEEK IMMEDIATE MEDICAL CARE IF:   You have severe cramps or pain in your back or belly (abdomen).  You have chills.  You have a  gush of fluid from the vagina.  You pass large clots or tissue from your vagina.  Your bleeding increases.  You feel light-headed or weak.  You pass out.  You feel less movement or no movement of the baby.  MAKE SURE YOU:  Understand these instructions.  Will watch your condition.  Will get help right away if you are not doing well or get worse. Document Released: 08/10/2002 Document Revised: 05/25/2013 Document Reviewed: 01/25/2013 [facility] Patient Information 2015 [place], [place]. This information is not intended to replace advice given to you by your health care provider. Make sure you discuss any questions you have with your health care provider.

## 2013-11-19 NOTE — MAU Note (Signed)
Pt reports she got up to go to the restroom and passed a large clot followed by pinkish discharge and lower abd pain.

## 2014-01-18 LAB — OB RESULTS CONSOLE GBS: GBS: POSITIVE

## 2014-01-25 ENCOUNTER — Encounter (HOSPITAL_COMMUNITY): Payer: Self-pay | Admitting: *Deleted

## 2014-01-25 ENCOUNTER — Telehealth (HOSPITAL_COMMUNITY): Payer: Self-pay | Admitting: *Deleted

## 2014-01-25 NOTE — Telephone Encounter (Signed)
Preadmission screen  

## 2014-01-29 ENCOUNTER — Inpatient Hospital Stay (HOSPITAL_COMMUNITY)
Admission: AD | Admit: 2014-01-29 | Discharge: 2014-02-01 | DRG: 775 | Disposition: A | Discharge status: Home / Self Care | Payer: 59 | Source: Ambulatory Visit | Attending: Obstetrics and Gynecology | Admitting: Obstetrics and Gynecology

## 2014-01-29 DIAGNOSIS — Z6838 Body mass index (BMI) 38.0-38.9, adult: Secondary | ICD-10-CM

## 2014-01-29 DIAGNOSIS — O9989 Other specified diseases and conditions complicating pregnancy, childbirth and the puerperium: Secondary | ICD-10-CM

## 2014-01-29 DIAGNOSIS — Z833 Family history of diabetes mellitus: Secondary | ICD-10-CM

## 2014-01-29 DIAGNOSIS — O99892 Other specified diseases and conditions complicating childbirth: Secondary | ICD-10-CM | POA: Diagnosis present

## 2014-01-29 DIAGNOSIS — Z22330 Carrier of Group B streptococcus: Secondary | ICD-10-CM

## 2014-01-29 DIAGNOSIS — O3460 Maternal care for abnormality of vagina, unspecified trimester: Secondary | ICD-10-CM | POA: Diagnosis present

## 2014-01-29 DIAGNOSIS — E669 Obesity, unspecified: Secondary | ICD-10-CM | POA: Diagnosis present

## 2014-01-29 DIAGNOSIS — O99214 Obesity complicating childbirth: Secondary | ICD-10-CM

## 2014-01-29 DIAGNOSIS — K219 Gastro-esophageal reflux disease without esophagitis: Secondary | ICD-10-CM | POA: Diagnosis present

## 2014-01-29 DIAGNOSIS — N189 Chronic kidney disease, unspecified: Secondary | ICD-10-CM | POA: Diagnosis present

## 2014-01-29 DIAGNOSIS — Q5211 Transverse vaginal septum: Secondary | ICD-10-CM

## 2014-01-29 DIAGNOSIS — O26839 Pregnancy related renal disease, unspecified trimester: Secondary | ICD-10-CM | POA: Diagnosis present

## 2014-01-29 NOTE — MAU Note (Signed)
Contractions last couple of days with some bloody show. Stronger contractions since 1800

## 2014-01-30 ENCOUNTER — Inpatient Hospital Stay (HOSPITAL_COMMUNITY): Payer: 59 | Admitting: Anesthesiology

## 2014-01-30 ENCOUNTER — Encounter (HOSPITAL_COMMUNITY): Payer: Self-pay

## 2014-01-30 ENCOUNTER — Encounter (HOSPITAL_COMMUNITY): Payer: 59 | Admitting: Anesthesiology

## 2014-01-30 DIAGNOSIS — Q5211 Transverse vaginal septum: Secondary | ICD-10-CM | POA: Diagnosis not present

## 2014-01-30 DIAGNOSIS — K219 Gastro-esophageal reflux disease without esophagitis: Secondary | ICD-10-CM | POA: Diagnosis present

## 2014-01-30 DIAGNOSIS — Z833 Family history of diabetes mellitus: Secondary | ICD-10-CM | POA: Diagnosis not present

## 2014-01-30 DIAGNOSIS — Z22330 Carrier of Group B streptococcus: Secondary | ICD-10-CM | POA: Diagnosis not present

## 2014-01-30 DIAGNOSIS — N189 Chronic kidney disease, unspecified: Secondary | ICD-10-CM | POA: Diagnosis present

## 2014-01-30 DIAGNOSIS — O3460 Maternal care for abnormality of vagina, unspecified trimester: Secondary | ICD-10-CM | POA: Diagnosis present

## 2014-01-30 DIAGNOSIS — O26839 Pregnancy related renal disease, unspecified trimester: Secondary | ICD-10-CM | POA: Diagnosis present

## 2014-01-30 DIAGNOSIS — Z6838 Body mass index (BMI) 38.0-38.9, adult: Secondary | ICD-10-CM | POA: Diagnosis not present

## 2014-01-30 DIAGNOSIS — E669 Obesity, unspecified: Secondary | ICD-10-CM | POA: Diagnosis present

## 2014-01-30 DIAGNOSIS — O479 False labor, unspecified: Secondary | ICD-10-CM | POA: Diagnosis present

## 2014-01-30 DIAGNOSIS — O99892 Other specified diseases and conditions complicating childbirth: Secondary | ICD-10-CM | POA: Diagnosis present

## 2014-01-30 LAB — RPR

## 2014-01-30 LAB — CBC
HCT: 35.2 % — ABNORMAL LOW (ref 36.0–46.0)
HEMOGLOBIN: 11.9 g/dL — AB (ref 12.0–15.0)
MCH: 27.7 pg (ref 26.0–34.0)
MCHC: 33.8 g/dL (ref 30.0–36.0)
MCV: 82.1 fL (ref 78.0–100.0)
Platelets: 201 10*3/uL (ref 150–400)
RBC: 4.29 MIL/uL (ref 3.87–5.11)
RDW: 13.5 % (ref 11.5–15.5)
WBC: 14.7 10*3/uL — ABNORMAL HIGH (ref 4.0–10.5)

## 2014-01-30 LAB — TYPE AND SCREEN
ABO/RH(D): A POS
Antibody Screen: NEGATIVE

## 2014-01-30 LAB — SAMPLE TO BLOOD BANK

## 2014-01-30 MED ORDER — FENTANYL 2.5 MCG/ML BUPIVACAINE 1/10 % EPIDURAL INFUSION (WH - ANES): 14.0000 mL/h | INTRAMUSCULAR | Status: DC | PRN

## 2014-01-30 MED ORDER — PHENYLEPHRINE 40 MCG/ML (10ML) SYRINGE FOR IV PUSH (FOR BLOOD PRESSURE SUPPORT)
80.0000 ug | PREFILLED_SYRINGE | INTRAVENOUS | Status: DC | PRN
  Filled 2014-01-30: qty 2

## 2014-01-30 MED ORDER — IBUPROFEN 600 MG PO TABS: 600.0000 mg | ORAL_TABLET | Freq: Four times a day (QID) | ORAL | Status: DC | PRN

## 2014-01-30 MED ORDER — LACTATED RINGERS IV SOLN: 500.0000 mL | INTRAVENOUS | Status: DC | PRN

## 2014-01-30 MED ORDER — LIDOCAINE HCL (PF) 1 % IJ SOLN
INTRAMUSCULAR | Status: DC | PRN
  Administered 2014-01-30 (×2): 4 mL

## 2014-01-30 MED ORDER — WITCH HAZEL-GLYCERIN EX PADS: 1.0000 "application " | MEDICATED_PAD | CUTANEOUS | Status: DC | PRN

## 2014-01-30 MED ORDER — BENZOCAINE-MENTHOL 20-0.5 % EX AERO
1.0000 "application " | INHALATION_SPRAY | CUTANEOUS | Status: DC | PRN
  Administered 2014-01-30 – 2014-01-31 (×2): 1 via TOPICAL
  Filled 2014-01-30 (×2): qty 56

## 2014-01-30 MED ORDER — OXYTOCIN BOLUS FROM INFUSION: 500.0000 mL | INTRAVENOUS | Status: DC

## 2014-01-30 MED ORDER — LANOLIN HYDROUS EX OINT: TOPICAL_OINTMENT | CUTANEOUS | Status: DC | PRN

## 2014-01-30 MED ORDER — SIMETHICONE 80 MG PO CHEW: 80.0000 mg | CHEWABLE_TABLET | ORAL | Status: DC | PRN

## 2014-01-30 MED ORDER — DIPHENHYDRAMINE HCL 50 MG/ML IJ SOLN: 12.5000 mg | INTRAMUSCULAR | Status: DC | PRN

## 2014-01-30 MED ORDER — OXYCODONE-ACETAMINOPHEN 5-325 MG PO TABS: 1.0000 | ORAL_TABLET | ORAL | Status: DC | PRN

## 2014-01-30 MED ORDER — ONDANSETRON HCL 4 MG PO TABS: 4.0000 mg | ORAL_TABLET | ORAL | Status: DC | PRN

## 2014-01-30 MED ORDER — FENTANYL 2.5 MCG/ML BUPIVACAINE 1/10 % EPIDURAL INFUSION (WH - ANES)
INTRAMUSCULAR | Status: DC | PRN
  Administered 2014-01-30: 14 mL/h via EPIDURAL

## 2014-01-30 MED ORDER — SENNOSIDES-DOCUSATE SODIUM 8.6-50 MG PO TABS
2.0000 | ORAL_TABLET | ORAL | Status: DC
  Administered 2014-01-31 – 2014-02-01 (×2): 2 via ORAL
  Filled 2014-01-30 (×2): qty 2

## 2014-01-30 MED ORDER — MEASLES, MUMPS & RUBELLA VAC ~~LOC~~ INJ: 0.5000 mL | INJECTION | Freq: Once | SUBCUTANEOUS | Status: DC

## 2014-01-30 MED ORDER — PHENYLEPHRINE 40 MCG/ML (10ML) SYRINGE FOR IV PUSH (FOR BLOOD PRESSURE SUPPORT)
PREFILLED_SYRINGE | INTRAVENOUS | Status: AC
  Filled 2014-01-30: qty 10

## 2014-01-30 MED ORDER — ONDANSETRON HCL 4 MG/2ML IJ SOLN: 4.0000 mg | INTRAMUSCULAR | Status: DC | PRN

## 2014-01-30 MED ORDER — CEFAZOLIN SODIUM 1-5 GM-% IV SOLN
1.0000 g | Freq: Four times a day (QID) | INTRAVENOUS | Status: DC
  Administered 2014-01-30 (×2): 1 g via INTRAVENOUS
  Filled 2014-01-30 (×4): qty 50

## 2014-01-30 MED ORDER — EPHEDRINE 5 MG/ML INJ
10.0000 mg | INTRAVENOUS | Status: DC | PRN
  Filled 2014-01-30: qty 2

## 2014-01-30 MED ORDER — IBUPROFEN 600 MG PO TABS
600.0000 mg | ORAL_TABLET | Freq: Four times a day (QID) | ORAL | Status: DC
  Administered 2014-01-30 – 2014-02-01 (×8): 600 mg via ORAL
  Filled 2014-01-30 (×8): qty 1

## 2014-01-30 MED ORDER — OXYTOCIN 40 UNITS IN LACTATED RINGERS INFUSION - SIMPLE MED
1.0000 m[IU]/min | INTRAVENOUS | Status: DC
  Administered 2014-01-30: 500 m[IU]/min via INTRAVENOUS
  Administered 2014-01-30: 2 m[IU]/min via INTRAVENOUS
  Administered 2014-01-30: 600 m[IU]/min via INTRAVENOUS
  Filled 2014-01-30: qty 1000

## 2014-01-30 MED ORDER — DIPHENHYDRAMINE HCL 25 MG PO CAPS: 25.0000 mg | ORAL_CAPSULE | Freq: Four times a day (QID) | ORAL | Status: DC | PRN

## 2014-01-30 MED ORDER — FLEET ENEMA 7-19 GM/118ML RE ENEM: 1.0000 | ENEMA | Freq: Every day | RECTAL | Status: DC | PRN

## 2014-01-30 MED ORDER — PRENATAL MULTIVITAMIN CH
1.0000 | ORAL_TABLET | Freq: Every day | ORAL | Status: DC
  Administered 2014-01-31: 1 via ORAL
  Filled 2014-01-30: qty 1

## 2014-01-30 MED ORDER — DIBUCAINE 1 % RE OINT: 1.0000 "application " | TOPICAL_OINTMENT | RECTAL | Status: DC | PRN

## 2014-01-30 MED ORDER — TETANUS-DIPHTH-ACELL PERTUSSIS 5-2.5-18.5 LF-MCG/0.5 IM SUSP: 0.5000 mL | Freq: Once | INTRAMUSCULAR | Status: DC

## 2014-01-30 MED ORDER — LACTATED RINGERS IV SOLN
500.0000 mL | Freq: Once | INTRAVENOUS | Status: AC
  Administered 2014-01-30: 500 mL via INTRAVENOUS

## 2014-01-30 MED ORDER — FLEET ENEMA 7-19 GM/118ML RE ENEM: 1.0000 | ENEMA | RECTAL | Status: DC | PRN

## 2014-01-30 MED ORDER — BISACODYL 10 MG RE SUPP: 10.0000 mg | Freq: Every day | RECTAL | Status: DC | PRN

## 2014-01-30 MED ORDER — CITRIC ACID-SODIUM CITRATE 334-500 MG/5ML PO SOLN: 30.0000 mL | ORAL | Status: DC | PRN

## 2014-01-30 MED ORDER — LIDOCAINE HCL (PF) 1 % IJ SOLN
30.0000 mL | INTRAMUSCULAR | Status: DC | PRN
  Filled 2014-01-30: qty 30

## 2014-01-30 MED ORDER — LACTATED RINGERS IV SOLN: INTRAVENOUS | Status: DC

## 2014-01-30 MED ORDER — MEDROXYPROGESTERONE ACETATE 150 MG/ML IM SUSP: 150.0000 mg | INTRAMUSCULAR | Status: DC | PRN

## 2014-01-30 MED ORDER — OXYTOCIN 40 UNITS IN LACTATED RINGERS INFUSION - SIMPLE MED: 62.5000 mL/h | INTRAVENOUS | Status: DC

## 2014-01-30 MED ORDER — ONDANSETRON HCL 4 MG/2ML IJ SOLN: 4.0000 mg | Freq: Four times a day (QID) | INTRAMUSCULAR | Status: DC | PRN

## 2014-01-30 MED ORDER — TERBUTALINE SULFATE 1 MG/ML IJ SOLN: 0.2500 mg | Freq: Once | INTRAMUSCULAR | Status: DC | PRN

## 2014-01-30 MED ORDER — ACETAMINOPHEN 325 MG PO TABS: 650.0000 mg | ORAL_TABLET | ORAL | Status: DC | PRN

## 2014-01-30 MED ORDER — FENTANYL 2.5 MCG/ML BUPIVACAINE 1/10 % EPIDURAL INFUSION (WH - ANES)
INTRAMUSCULAR | Status: AC
Start: 2014-01-30 — End: 2014-01-30
  Administered 2014-01-30: 08:00:00
  Filled 2014-01-30: qty 125

## 2014-01-30 MED ORDER — ZOLPIDEM TARTRATE 5 MG PO TABS: 5.0000 mg | ORAL_TABLET | Freq: Every evening | ORAL | Status: DC | PRN

## 2014-01-30 NOTE — H&P (Signed)
Elizabeth Foster is a 28 y.o. G 2 P 1011 at 39 weeks presents in early labor. OB history for vaginal septum, GBBS + and resistant to clindamycin.EFW 8 pound by recent ultrasound. Last delivery was NSVD 7 1/2 pounds with 8 hour labor. Maternal Medical History:  Reason for admission: Contractions.   Contractions: Frequency: irregular.   Perceived severity is moderate.    Fetal activity: Perceived fetal activity is normal.      OB History   Grav Para Term Preterm Abortions TAB SAB Ect Mult Living   2 1 1       1      Past Medical History  Diagnosis Date  . Chronic kidney disease     chronic uti and kidney infections  . GERD (gastroesophageal reflux disease)    Past Surgical History  Procedure Laterality Date  . Knee arthroscopy      x2  . Tonsillectomy    . Adenoidectomy    . Middle ear surgery      benign growth removed from eardrum   Family History: family history includes Anemia in her mother; COPD in her maternal grandfather and maternal grandmother; Diabetes in her mother and paternal grandfather; Heart disease in her paternal grandfather; Thyroid disease in her maternal grandmother and mother. There is no history of Anesthesia problems, Hypotension, Malignant hyperthermia, or Pseudochol deficiency. Social History:  reports that she has never smoked. She has never used smokeless tobacco. She reports that she does not drink alcohol or use illicit drugs.   Prenatal Transfer Tool  Maternal Diabetes: No Genetic Screening: Normal Maternal Ultrasounds/Referrals: Normal Fetal Ultrasounds or other Referrals:  None Maternal Substance Abuse:  No Significant Maternal Medications:  None Significant Maternal Lab Results:  None Other Comments:  None  Review of Systems  All other systems reviewed and are negative.   Dilation: 4 Effacement (%): 80 Station: -2 Exam by:: Dr Helane Rima Blood pressure 124/68, pulse 120, temperature 97.8 F (36.6 C), temperature source Oral, resp. rate  18, height 5\' 4"  (1.626 m), weight 101.334 kg (223 lb 6.4 oz), last menstrual period 05/02/2013, SpO2 99.00%. Maternal Exam:  Uterine Assessment: Contraction strength is moderate.  Contraction frequency is regular.   Abdomen: Estimated fetal weight is 8 pounds.   Fetal presentation: vertex  Pelvis: adequate for delivery.   Cervix: Cervix evaluated by digital exam.     Fetal Exam Fetal State Assessment: Category I - tracings are normal.     Physical Exam  Nursing note and vitals reviewed. Constitutional: She appears well-developed.  HENT:  Head: Normocephalic.  Eyes: Pupils are equal, round, and reactive to light.  Neck: Normal range of motion.  Cardiovascular: Normal rate and regular rhythm.   Respiratory: Effort normal and breath sounds normal.  GI: Soft.  Genitourinary:  Vaginal septum palpated    Prenatal labs: ABO, Rh: --/--/A POS (08/30 0355) Antibody: NEG (08/30 0355) Rubella: Immune (01/29 0000) RPR: Nonreactive (01/29 0000)  HBsAg: Negative (01/29 0000)  HIV: Non-reactive (01/29 0000)  GBS: Positive (08/18 0000)   Assessment/Plan: IUP at 39 weeks LABOR Vaginal Septum GBBS + Epidural  Pitocin Follow Labor curve Ancef since Clindamycin resistant Plan of care reviewed with patient  Khori Underberg L 01/30/2014, 7:43 AM

## 2014-01-30 NOTE — Lactation Note (Signed)
This note was copied from the chart of [place]. Lactation Consultation Note  Patient Name: Elizabeth Foster [id number] Date: 01/30/2014 Reason for consult: Initial assessment of this mother/baby dyad at 7 hours postpartum.  Mom has almost 28 yo daughter but only nursed briefly due to extreme latch difficulty and nipple soreness with blood blisters.  Mom denies latching problems with her newborn son and is just finishing a 10 minute feeding on (R) breast so baby has slowed down his sucking pattern.  Initial LATCH score=10 per RN assessment.  LC encouraged frequent STS and cue feedings.  Mom encouraged to feed baby 8-12 times/24 hours and with feeding cues. LC encouraged review of Baby and Me pp 9, 14 and 20-25 for STS and BF information. LC provided [employer] Resource brochure and reviewed [facility] services and list of community and web site resources.    Maternal Data Formula Feeding for Exclusion: No Has patient been taught Hand Expression?: Yes (mom states she knows how to hand express; LC encouraged ebm on nipples after feedings) Does the patient have breastfeeding experience prior to this delivery?: Yes  Feeding Feeding Type: Breast Fed Length of feed: 10 min  LATCH Score/Interventions           initial LATCH score=10           Lactation Tools Discussed/Used   STS, cue feedings, hand expression, nipple care with ebm between feedings  Consult Status Consult Status: Follow-up Date: 01/31/14 Follow-up type: In-patient    Junious Dresser [facility] 01/30/2014, 7:26 PM

## 2014-01-30 NOTE — Anesthesia Preprocedure Evaluation (Signed)
Anesthesia Evaluation  Patient identified by MRN, date of birth, ID band Patient awake    Reviewed: Allergy & Precautions, H&P , Patient's Chart, lab work & pertinent test results  Airway Mallampati: III TM Distance: >3 FB Neck ROM: full    Dental no notable dental hx. (+) Teeth Intact   Pulmonary neg pulmonary ROS,  breath sounds clear to auscultation  Pulmonary exam normal       Cardiovascular negative cardio ROS  Rhythm:regular Rate:Normal     Neuro/Psych negative neurological ROS  negative psych ROS   GI/Hepatic Neg liver ROS, GERD-  Medicated and Controlled,  Endo/Other  Morbid obesity  Renal/GU negative Renal ROS     Musculoskeletal   Abdominal Normal abdominal exam  (+)   Peds  Hematology negative hematology ROS (+)   Anesthesia Other Findings   Reproductive/Obstetrics (+) Pregnancy                           Anesthesia Physical  Anesthesia Plan  ASA: III  Anesthesia Plan: Epidural   Post-op Pain Management:    Induction:   Airway Management Planned:   Additional Equipment:   Intra-op Plan:   Post-operative Plan:   Informed Consent: I have reviewed the patients History and Physical, chart, labs and discussed the procedure including the risks, benefits and alternatives for the proposed anesthesia with the patient or authorized representative who has indicated his/her understanding and acceptance.     Plan Discussed with:   Anesthesia Plan Comments:         Anesthesia Quick Evaluation

## 2014-01-30 NOTE — Anesthesia Procedure Notes (Signed)
Epidural Patient location during procedure: OB  Staffing Anesthesiologist: Nolon Nations R Performed by: anesthesiologist   Preanesthetic Checklist Completed: patient identified, pre-op evaluation, timeout performed, IV checked, risks and benefits discussed and monitors and equipment checked  Epidural Patient position: sitting Prep: site prepped and draped and DuraPrep Patient monitoring: heart rate Approach: midline Location: L3-L4 Injection technique: LOR air and LOR saline  Needle:  Needle type: Tuohy  Needle gauge: 17 G Needle length: 9 cm Needle insertion depth: 6 cm Catheter type: closed end flexible Catheter size: 19 Gauge Catheter at skin depth: 12 cm Test dose: negative  Assessment Sensory level: T8 Events: blood not aspirated, injection not painful, no injection resistance, negative IV test and no paresthesia  Additional Notes Reason for block:procedure for pain

## 2014-01-31 LAB — CBC
HCT: 33.2 % — ABNORMAL LOW (ref 36.0–46.0)
Hemoglobin: 10.9 g/dL — ABNORMAL LOW (ref 12.0–15.0)
MCH: 27.3 pg (ref 26.0–34.0)
MCHC: 32.8 g/dL (ref 30.0–36.0)
MCV: 83 fL (ref 78.0–100.0)
Platelets: 170 10*3/uL (ref 150–400)
RBC: 4 MIL/uL (ref 3.87–5.11)
RDW: 13.5 % (ref 11.5–15.5)
WBC: 15.2 10*3/uL — ABNORMAL HIGH (ref 4.0–10.5)

## 2014-01-31 NOTE — Anesthesia Postprocedure Evaluation (Signed)
  Anesthesia Post-op Note  Patient: Elizabeth Foster  Procedure(s) Performed: * No procedures listed *  Patient Location: Mother/Baby  Anesthesia Type:Epidural  Level of Consciousness: awake, alert , oriented and patient cooperative  Airway and Oxygen Therapy: Patient Spontanous Breathing  Post-op Pain: mild  Post-op Assessment: Patient's Cardiovascular Status Stable, Respiratory Function Stable, No headache, No backache, No residual numbness and No residual motor weakness  Post-op Vital Signs: stable  Last Vitals:  Filed Vitals:   01/31/14 0657  BP: 109/68  Pulse: 69  Temp: 36.3 C  Resp: 18    Complications: No apparent anesthesia complications

## 2014-01-31 NOTE — Progress Notes (Signed)
Post Partum Day 1 Subjective: no complaints, up ad lib, voiding, tolerating PO and + flatus  Objective: Blood pressure 109/68, pulse 69, temperature 97.4 F (36.3 C), temperature source Oral, resp. rate 18, height 5\' 4"  (1.626 m), weight 223 lb 6.4 oz (101.334 kg), last menstrual period 05/02/2013, SpO2 100.00%, unknown if currently breastfeeding.  Physical Exam:  General: alert and cooperative Lochia: appropriate Uterine Fundus: firm Incision: perineum intact DVT Evaluation: No evidence of DVT seen on physical exam. Negative Homan's sign. No cords or calf tenderness. Calf/Ankle edema is present.   Recent Labs  01/30/14 0355 01/31/14 0620  HGB 11.9* 10.9*  HCT 35.2* 33.2*    Assessment/Plan: Plan for discharge tomorrow and Circumcision prior to discharge   LOS: 2 days   Evelyna Folker G 01/31/2014, 8:24 AM

## 2014-01-31 NOTE — Addendum Note (Signed)
Addendum created 01/31/14 1607 by Georgeanne Nim, CRNA   Modules edited: Charges VN, Notes Section   Notes Section:  File: [id number]

## 2014-01-31 NOTE — Lactation Note (Signed)
This note was copied from the chart of [place]. Lactation Consultation Note  Follow up visit made.  Mom has just pumped 4 mls of colostrum and currently attempting to bottle feed formula.  She states baby has been gagging some at breast and nipples becoming sore.  She may decide to pump and bottle feed but agrees to latch assist.  With good breast compression baby latched well and nursed in good sucking bursts.  Expressed milk syringe fed to baby at breast.  When asking mother how she felt she verbalizes feeling stressed about it and unsure how her [age] year old will react.  Support given.  Encouraged to call for latch assist or any concerns with pumping.  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  Patient Name: Elizabeth Foster [id number] Date: 01/31/2014 Reason for consult: Follow-up assessment   Maternal Data    Feeding Feeding Type: Breast Milk Length of feed: 1 min  LATCH Score/Interventions Latch: Grasps breast easily, tongue down, lips flanged, rhythmical sucking.  Audible Swallowing: A few with stimulation  Type of Nipple: Everted at rest and after stimulation  Comfort (Breast/Nipple): Soft / non-tender     Hold (Positioning): Assistance needed to correctly position infant at breast and maintain latch. Intervention(s): Breastfeeding basics reviewed;Support Pillows  LATCH Score: 8  Lactation Tools Discussed/Used WIC Program: No Pump Review: Setup, frequency, and cleaning;Milk Storage Initiated by:: MBU, RN Date initiated:: 01/31/14   Consult Status Consult Status: Follow-up Date: 02/01/14 Follow-up type: In-patient    Ave Filter 01/31/2014, 3:00 PM

## 2014-02-01 ENCOUNTER — Inpatient Hospital Stay (HOSPITAL_COMMUNITY): Payer: 59

## 2014-02-01 MED ORDER — OXYCODONE-ACETAMINOPHEN 5-325 MG PO TABS: 1.0000 | ORAL_TABLET | ORAL | Status: DC | PRN

## 2014-02-01 MED ORDER — IBUPROFEN 600 MG PO TABS: 600.0000 mg | ORAL_TABLET | Freq: Four times a day (QID) | ORAL | Status: DC

## 2014-02-01 NOTE — Discharge Summary (Signed)
Obstetric Discharge Summary Reason for Admission: onset of labor Prenatal Procedures: ultrasound Intrapartum Procedures: spontaneous vaginal delivery Postpartum Procedures: none Complications-Operative and Postpartum: 2 degree perineal laceration Hemoglobin  Date Value Ref Range Status  01/31/2014 10.9* 12.0 - 15.0 g/dL Final     HCT  Date Value Ref Range Status  01/31/2014 33.2* 36.0 - 46.0 % Final    Physical Exam:  General: alert and cooperative Lochia: appropriate Uterine Fundus: firm Incision: perineum intact DVT Evaluation: No evidence of DVT seen on physical exam. Negative Homan's sign. No cords or calf tenderness. Calf/Ankle edema is present.  Discharge Diagnoses: Term Pregnancy-delivered  Discharge Information: Date: 02/01/2014 Activity: pelvic rest Diet: routine Medications: PNV, Ibuprofen and Percocet Condition: stable Instructions: refer to practice specific booklet Discharge to: home Follow-up Information   Follow up with Luz Lex, MD On 03/08/2014. (11:10 am)    Specialty:  Obstetrics and Gynecology   Contact information:   [place], [place] [address] [phone]       Newborn Data: Live born female  Birth Weight: 8 lb 8 oz (3856 g) APGAR: 8, 9  Home with mother.  Menashe Kafer G 02/01/2014, 8:17 AM

## 2014-02-01 NOTE — Lactation Note (Signed)
This note was copied from the chart of Elizabeth Foster. Lactation Consultation Note  Patient Name: Elizabeth Foster [id number] Date: 02/01/2014 Reason for consult: Follow-up assessment Per  Mom had been bottle feeding all night due to nipple soreness, pumping and using comfort gels. Per mom the comfort gels are helping. LC assessed breast tissue with moms permission, nipples  Light pink in color , no breakdown noted , nipples erect and areolas compressible. LC discussed with  Mom  The possibility of re-latching at this consult with assist and mom consented. Had mom massage breast , hand express, steady flow of colostrum noted. Baby awake and hungry , assisted mom in football position , depth obtained , multiply swallows noted, increased with compressions  And baby fed for 15 mins and released on his own. Nipple appeared normal shape. Discussed with mom latching the baby at the breast is a lot less work compared to pumping and bottle feeding. Mom receptive to assistance  And teaching. Reviewed sore nipple and engorgement prevention and tx , Mom plans to obtain a DEBP Medela pump. Mother informed of post-discharge support and given phone number to the lactation department, including services for phone call assistance;  out-patient appointments; and breastfeeding support group. List of other breastfeeding resources in the community given in the handout. Encouraged mother to call for problems or concerns related to breastfeeding.    Maternal Data Has patient been taught Hand Expression?: Yes  Feeding Feeding Type: Breast Fed Length of feed: 15 min  LATCH Score/Interventions Latch: Grasps breast easily, tongue down, lips flanged, rhythmical sucking.  Audible Swallowing: Spontaneous and intermittent Intervention(s): Hand expression;Alternate breast massage  Type of Nipple: Everted at rest and after stimulation  Comfort (Breast/Nipple): Filling, red/small blisters or bruises, mild/mod  discomfort  Problem noted: Mild/Moderate discomfort;Filling Interventions (Mild/moderate discomfort): Hand expression;Hand massage  Hold (Positioning): Assistance needed to correctly position infant at breast and maintain latch. Intervention(s): Breastfeeding basics reviewed;Support Pillows;Position options;Skin to skin  LATCH Score: 8  Lactation Tools Discussed/Used     Consult Status Consult Status: Complete Date: 02/01/14 Follow-up type: In-patient    Myer Haff 02/01/2014, 9:57 AM

## 2014-02-02 ENCOUNTER — Inpatient Hospital Stay (HOSPITAL_COMMUNITY): Admission: RE | Admit: 2014-02-02 | Payer: 59 | Source: Ambulatory Visit

## 2014-04-04 ENCOUNTER — Encounter (HOSPITAL_COMMUNITY): Payer: Self-pay

## 2015-05-17 ENCOUNTER — Ambulatory Visit (INDEPENDENT_AMBULATORY_CARE_PROVIDER_SITE_OTHER): Payer: 59 | Admitting: Family Medicine

## 2015-05-17 ENCOUNTER — Encounter: Payer: Self-pay | Admitting: Family Medicine

## 2015-05-17 VITALS — BP 110/70 | Ht 64.0 in | Wt 213.4 lb

## 2015-05-17 DIAGNOSIS — Z13220 Encounter for screening for lipoid disorders: Secondary | ICD-10-CM

## 2015-05-17 DIAGNOSIS — R5383 Other fatigue: Secondary | ICD-10-CM

## 2015-05-17 DIAGNOSIS — Z131 Encounter for screening for diabetes mellitus: Secondary | ICD-10-CM

## 2015-05-17 MED ORDER — RANITIDINE HCL 300 MG PO TABS: 300.0000 mg | ORAL_TABLET | Freq: Every day | ORAL | Status: DC

## 2015-05-17 MED ORDER — PHENTERMINE HCL 37.5 MG PO CAPS: 37.5000 mg | ORAL_CAPSULE | ORAL | Status: DC

## 2015-05-17 NOTE — Progress Notes (Signed)
   Subjective:    Patient ID: Elizabeth Foster, female    DOB: [birth year], 29 y.o.   MRN: [id number]  HPI Patient is here today for a general check up/ not physical. Patient states that her husband was told by our office that if she didn't come in for an office visit then she would no longer be a patient with our practice.   Patient would like to have blood work done also.  Patient with intermittent fatigue and tiredness. Struggling with her weight. Try to watch diet exercise and self denies any setbacks no chest tightness pressure pain shortness of breath does have family history of diabetes and thyroid patient also had some anemia last year although corrected recently  Review of Systems  Constitutional: Negative for activity change, appetite change and fatigue.  HENT: Negative for congestion.   Respiratory: Negative for cough.   Cardiovascular: Negative for chest pain.  Gastrointestinal: Negative for abdominal pain.  Endocrine: Negative for polydipsia and polyphagia.  Neurological: Negative for weakness.  Psychiatric/Behavioral: Negative for confusion.       Objective:   Physical Exam  Constitutional: She appears well-nourished. No distress.  Cardiovascular: Normal rate, regular rhythm and normal heart sounds.   No murmur heard. Pulmonary/Chest: Effort normal and breath sounds normal. No respiratory distress.  Musculoskeletal: She exhibits no edema.  Lymphadenopathy:    She has no cervical adenopathy.  Neurological: She is alert. She exhibits normal muscle tone.  Psychiatric: Her behavior is normal.  Vitals reviewed.         Assessment & Plan:  Lab work ordered Phentermine for diet may use this for a maximum of months patient tolerates this no side effects Recommend watching diet exercising Fatigue related to combination of time parenting and lack of rest Check lab work await results

## 2015-05-18 LAB — CBC WITH DIFFERENTIAL/PLATELET
BASOS: 1 %
Basophils Absolute: 0.1 10*3/uL (ref 0.0–0.2)
EOS (ABSOLUTE): 0.3 10*3/uL (ref 0.0–0.4)
EOS: 4 %
HEMATOCRIT: 39.6 % (ref 34.0–46.6)
Hemoglobin: 13.5 g/dL (ref 11.1–15.9)
IMMATURE GRANS (ABS): 0 10*3/uL (ref 0.0–0.1)
Immature Granulocytes: 0 %
LYMPHS: 31 %
Lymphocytes Absolute: 2.1 10*3/uL (ref 0.7–3.1)
MCH: 28.9 pg (ref 26.6–33.0)
MCHC: 34.1 g/dL (ref 31.5–35.7)
MCV: 85 fL (ref 79–97)
Monocytes Absolute: 0.5 10*3/uL (ref 0.1–0.9)
Monocytes: 7 %
NEUTROS ABS: 3.9 10*3/uL (ref 1.4–7.0)
Neutrophils: 57 %
Platelets: 293 10*3/uL (ref 150–379)
RBC: 4.67 x10E6/uL (ref 3.77–5.28)
RDW: 13.6 % (ref 12.3–15.4)
WBC: 6.9 10*3/uL (ref 3.4–10.8)

## 2015-05-18 LAB — LIPID PANEL
CHOL/HDL RATIO: 4 ratio (ref 0.0–4.4)
Cholesterol, Total: 196 mg/dL (ref 100–199)
HDL: 49 mg/dL (ref >39)
LDL Calculated: 127 mg/dL — ABNORMAL HIGH (ref 0–99)
TRIGLYCERIDES: 102 mg/dL (ref 0–149)
VLDL CHOLESTEROL CAL: 20 mg/dL (ref 5–40)

## 2015-05-18 LAB — GLUCOSE, RANDOM: Glucose: 97 mg/dL (ref 65–99)

## 2015-05-18 LAB — TSH: TSH: 1 u[IU]/mL (ref 0.450–4.500)

## 2015-06-15 DIAGNOSIS — H02719 Chloasma of unspecified eye, unspecified eyelid and periocular area: Secondary | ICD-10-CM | POA: Diagnosis not present

## 2015-06-15 DIAGNOSIS — L814 Other melanin hyperpigmentation: Secondary | ICD-10-CM | POA: Diagnosis not present

## 2015-06-15 DIAGNOSIS — L821 Other seborrheic keratosis: Secondary | ICD-10-CM | POA: Diagnosis not present

## 2015-06-15 DIAGNOSIS — D225 Melanocytic nevi of trunk: Secondary | ICD-10-CM | POA: Diagnosis not present

## 2015-06-15 DIAGNOSIS — D2271 Melanocytic nevi of right lower limb, including hip: Secondary | ICD-10-CM | POA: Diagnosis not present

## 2015-06-15 DIAGNOSIS — D1801 Hemangioma of skin and subcutaneous tissue: Secondary | ICD-10-CM | POA: Diagnosis not present

## 2015-06-15 DIAGNOSIS — L820 Inflamed seborrheic keratosis: Secondary | ICD-10-CM | POA: Diagnosis not present

## 2016-01-31 DIAGNOSIS — H5213 Myopia, bilateral: Secondary | ICD-10-CM | POA: Diagnosis not present

## 2016-01-31 DIAGNOSIS — H52203 Unspecified astigmatism, bilateral: Secondary | ICD-10-CM | POA: Diagnosis not present

## 2016-04-11 DIAGNOSIS — Z01419 Encounter for gynecological examination (general) (routine) without abnormal findings: Secondary | ICD-10-CM | POA: Diagnosis not present

## 2016-04-11 DIAGNOSIS — Z6836 Body mass index (BMI) 36.0-36.9, adult: Secondary | ICD-10-CM | POA: Diagnosis not present

## 2016-05-20 ENCOUNTER — Telehealth: Payer: 59 | Admitting: Family

## 2016-05-20 DIAGNOSIS — B9689 Other specified bacterial agents as the cause of diseases classified elsewhere: Secondary | ICD-10-CM

## 2016-05-20 DIAGNOSIS — J028 Acute pharyngitis due to other specified organisms: Secondary | ICD-10-CM | POA: Diagnosis not present

## 2016-05-20 MED ORDER — AZITHROMYCIN 250 MG PO TABS: ORAL_TABLET | ORAL | 0 refills | Status: DC

## 2016-05-20 NOTE — Progress Notes (Signed)
We are sorry that you are not feeling well.  Here is how we plan to help!  Based on what you have shared with me it looks like you have upper respiratory tract inflammation that has resulted in a significant cough.  Inflammation and infection in the upper respiratory tract is commonly called bronchitis and has four common causes:  Allergies, Viral Infections, Acid Reflux and Bacterial Infections.  Allergies, viruses and acid reflux are treated by controlling symptoms or eliminating the cause. An example might be a cough caused by taking certain blood pressure medications. You stop the cough by changing the medication. Another example might be a cough caused by acid reflux. Controlling the reflux helps control the cough.  Based on your presentation I believe you most likely have A cough due to bacteria.  When patients have a fever and a productive cough with a change in color or increased sputum production, we are concerned about bacterial bronchitis.  If left untreated it can progress to pneumonia.  If your symptoms do not improve with your treatment plan it is important that you contact your provider.   I have prescribed Azithromyin 250 mg: two tables now and then one tablet daily for 4 additonal days    This is safe during pregnancy. There is not much else we can prescribe aside from supportive care as listed below.   USE OF BRONCHODILATOR ("RESCUE") INHALERS: There is a risk from using your bronchodilator too frequently.  The risk is that over-reliance on a medication which only relaxes the muscles surrounding the breathing tubes can reduce the effectiveness of medications prescribed to reduce swelling and congestion of the tubes themselves.  Although you feel brief relief from the bronchodilator inhaler, your asthma may actually be worsening with the tubes becoming more swollen and filled with mucus.  This can delay other crucial treatments, such as oral steroid medications. If you need to use a  bronchodilator inhaler daily, several times per day, you should discuss this with your provider.  There are probably better treatments that could be used to keep your asthma under control.     HOME CARE . Only take medications as instructed by your medical team. . Complete the entire course of an antibiotic. . Drink plenty of fluids and get plenty of rest. . Avoid close contacts especially the very young and the elderly . Cover your mouth if you cough or cough into your sleeve. . Always remember to wash your hands . A steam or ultrasonic humidifier can help congestion.   GET HELP RIGHT AWAY IF: . You develop worsening fever. . You become short of breath . You cough up blood. . Your symptoms persist after you have completed your treatment plan MAKE SURE YOU   Understand these instructions.  Will watch your condition.  Will get help right away if you are not doing well or get worse.  Your e-visit answers were reviewed by a board certified advanced clinical practitioner to complete your personal care plan.  Depending on the condition, your plan could have included both over the counter or prescription medications. If there is a problem please reply  once you have received a response from your provider. Your safety is important to [place].  If you have drug allergies check your prescription carefully.    You can use MyChart to ask questions about today's visit, request a non-urgent call back, or ask for a work or school excuse for 24 hours related to this e-Visit. If it has  greater than 24 hours you will need to follow up with your provider, or enter a new e-Visit to address those concerns. You will get an e-mail in the next two days asking about your experience.  I hope that your e-visit has been valuable and will speed your recovery. Thank you for using e-visits.   

## 2016-07-15 DIAGNOSIS — M9902 Segmental and somatic dysfunction of thoracic region: Secondary | ICD-10-CM | POA: Diagnosis not present

## 2016-07-24 ENCOUNTER — Telehealth: Payer: 59 | Admitting: Family

## 2016-07-24 DIAGNOSIS — J019 Acute sinusitis, unspecified: Secondary | ICD-10-CM | POA: Diagnosis not present

## 2016-07-24 MED ORDER — DOXYCYCLINE HYCLATE 100 MG PO TABS: 100.0000 mg | ORAL_TABLET | Freq: Two times a day (BID) | ORAL | 0 refills | Status: DC

## 2016-07-24 NOTE — Progress Notes (Signed)

## 2016-07-31 DIAGNOSIS — M9902 Segmental and somatic dysfunction of thoracic region: Secondary | ICD-10-CM | POA: Diagnosis not present

## 2016-09-11 DIAGNOSIS — N911 Secondary amenorrhea: Secondary | ICD-10-CM | POA: Diagnosis not present

## 2016-09-12 MED FILL — DICLEGIS DR 10-10 MG TABLET: 10-10 | 30 days supply | Qty: 120 | Fill #0

## 2016-09-18 DIAGNOSIS — Z3685 Encounter for antenatal screening for Streptococcus B: Secondary | ICD-10-CM | POA: Diagnosis not present

## 2016-09-18 DIAGNOSIS — Z3481 Encounter for supervision of other normal pregnancy, first trimester: Secondary | ICD-10-CM | POA: Diagnosis not present

## 2016-09-30 DIAGNOSIS — Z3401 Encounter for supervision of normal first pregnancy, first trimester: Secondary | ICD-10-CM | POA: Diagnosis not present

## 2016-09-30 DIAGNOSIS — Z36 Encounter for antenatal screening for chromosomal anomalies: Secondary | ICD-10-CM | POA: Diagnosis not present

## 2016-10-14 DIAGNOSIS — O09521 Supervision of elderly multigravida, first trimester: Secondary | ICD-10-CM | POA: Diagnosis not present

## 2016-10-14 DIAGNOSIS — Z3A12 12 weeks gestation of pregnancy: Secondary | ICD-10-CM | POA: Diagnosis not present

## 2016-10-14 DIAGNOSIS — Z3682 Encounter for antenatal screening for nuchal translucency: Secondary | ICD-10-CM | POA: Diagnosis not present

## 2016-11-16 ENCOUNTER — Ambulatory Visit (HOSPITAL_COMMUNITY)
Admission: RE | Admit: 2016-11-16 | Discharge: 2016-11-16 | Disposition: A | Discharge status: Home / Self Care | Payer: 59 | Source: Ambulatory Visit | Attending: Obstetrics and Gynecology | Admitting: Obstetrics and Gynecology

## 2016-11-16 ENCOUNTER — Other Ambulatory Visit (HOSPITAL_COMMUNITY): Payer: Self-pay | Admitting: Obstetrics and Gynecology

## 2016-11-16 ENCOUNTER — Ambulatory Visit (HOSPITAL_COMMUNITY): Admission: RE | Admit: 2016-11-16 | Payer: 59 | Source: Ambulatory Visit | Admitting: Obstetrics and Gynecology

## 2016-11-16 DIAGNOSIS — Z3A17 17 weeks gestation of pregnancy: Secondary | ICD-10-CM

## 2016-11-16 DIAGNOSIS — O4692 Antepartum hemorrhage, unspecified, second trimester: Secondary | ICD-10-CM | POA: Insufficient documentation

## 2016-11-27 ENCOUNTER — Telehealth: Payer: 59 | Admitting: Nurse Practitioner

## 2016-11-27 DIAGNOSIS — H578 Other specified disorders of eye and adnexa: Secondary | ICD-10-CM | POA: Diagnosis not present

## 2016-11-27 DIAGNOSIS — H5789 Other specified disorders of eye and adnexa: Secondary | ICD-10-CM

## 2016-11-27 MED ORDER — POLYMYXIN B-TRIMETHOPRIM 10000-0.1 UNIT/ML-% OP SOLN: 2.0000 [drp] | Freq: Four times a day (QID) | OPHTHALMIC | 0 refills | Status: DC

## 2016-11-27 NOTE — Progress Notes (Signed)

## 2016-11-28 DIAGNOSIS — Z363 Encounter for antenatal screening for malformations: Secondary | ICD-10-CM | POA: Diagnosis not present

## 2016-11-28 DIAGNOSIS — Z3A18 18 weeks gestation of pregnancy: Secondary | ICD-10-CM | POA: Diagnosis not present

## 2016-11-28 DIAGNOSIS — Z3400 Encounter for supervision of normal first pregnancy, unspecified trimester: Secondary | ICD-10-CM | POA: Diagnosis not present

## 2016-11-28 DIAGNOSIS — Z3480 Encounter for supervision of other normal pregnancy, unspecified trimester: Secondary | ICD-10-CM | POA: Diagnosis not present

## 2017-01-23 DIAGNOSIS — Z3492 Encounter for supervision of normal pregnancy, unspecified, second trimester: Secondary | ICD-10-CM | POA: Diagnosis not present

## 2017-01-23 DIAGNOSIS — Z23 Encounter for immunization: Secondary | ICD-10-CM | POA: Diagnosis not present

## 2017-01-23 DIAGNOSIS — Z3480 Encounter for supervision of other normal pregnancy, unspecified trimester: Secondary | ICD-10-CM | POA: Diagnosis not present

## 2017-02-12 DIAGNOSIS — H52203 Unspecified astigmatism, bilateral: Secondary | ICD-10-CM | POA: Diagnosis not present

## 2017-02-12 DIAGNOSIS — H5213 Myopia, bilateral: Secondary | ICD-10-CM | POA: Diagnosis not present

## 2017-02-27 DIAGNOSIS — Z3A31 31 weeks gestation of pregnancy: Secondary | ICD-10-CM | POA: Diagnosis not present

## 2017-02-27 DIAGNOSIS — Z3400 Encounter for supervision of normal first pregnancy, unspecified trimester: Secondary | ICD-10-CM | POA: Diagnosis not present

## 2017-02-27 DIAGNOSIS — O3663X0 Maternal care for excessive fetal growth, third trimester, not applicable or unspecified: Secondary | ICD-10-CM | POA: Diagnosis not present

## 2017-03-06 DIAGNOSIS — Z3688 Encounter for antenatal screening for fetal macrosomia: Secondary | ICD-10-CM | POA: Diagnosis not present

## 2017-03-26 DIAGNOSIS — Z3685 Encounter for antenatal screening for Streptococcus B: Secondary | ICD-10-CM | POA: Diagnosis not present

## 2017-04-05 ENCOUNTER — Encounter (HOSPITAL_COMMUNITY)
Admission: AD | Disposition: A | Discharge status: Home / Self Care | Payer: Self-pay | Source: Ambulatory Visit | Attending: Obstetrics and Gynecology

## 2017-04-05 ENCOUNTER — Inpatient Hospital Stay (HOSPITAL_COMMUNITY): Payer: 59 | Admitting: Anesthesiology

## 2017-04-05 ENCOUNTER — Encounter (HOSPITAL_COMMUNITY): Payer: Self-pay

## 2017-04-05 ENCOUNTER — Inpatient Hospital Stay (HOSPITAL_COMMUNITY)
Admission: AD | Admit: 2017-04-05 | Discharge: 2017-04-08 | DRG: 784 | Disposition: A | Discharge status: Home / Self Care | Payer: 59 | Source: Ambulatory Visit | Attending: Obstetrics and Gynecology | Admitting: Obstetrics and Gynecology

## 2017-04-05 DIAGNOSIS — Z3A37 37 weeks gestation of pregnancy: Secondary | ICD-10-CM | POA: Diagnosis not present

## 2017-04-05 DIAGNOSIS — O26833 Pregnancy related renal disease, third trimester: Secondary | ICD-10-CM | POA: Diagnosis present

## 2017-04-05 DIAGNOSIS — Z98891 History of uterine scar from previous surgery: Secondary | ICD-10-CM

## 2017-04-05 DIAGNOSIS — R51 Headache: Secondary | ICD-10-CM

## 2017-04-05 DIAGNOSIS — O134 Gestational [pregnancy-induced] hypertension without significant proteinuria, complicating childbirth: Secondary | ICD-10-CM | POA: Diagnosis not present

## 2017-04-05 DIAGNOSIS — N189 Chronic kidney disease, unspecified: Secondary | ICD-10-CM | POA: Diagnosis present

## 2017-04-05 DIAGNOSIS — O133 Gestational [pregnancy-induced] hypertension without significant proteinuria, third trimester: Secondary | ICD-10-CM

## 2017-04-05 DIAGNOSIS — Z302 Encounter for sterilization: Secondary | ICD-10-CM | POA: Diagnosis not present

## 2017-04-05 DIAGNOSIS — O26893 Other specified pregnancy related conditions, third trimester: Secondary | ICD-10-CM

## 2017-04-05 DIAGNOSIS — O99214 Obesity complicating childbirth: Secondary | ICD-10-CM | POA: Diagnosis present

## 2017-04-05 DIAGNOSIS — O99824 Streptococcus B carrier state complicating childbirth: Secondary | ICD-10-CM | POA: Diagnosis present

## 2017-04-05 DIAGNOSIS — O3663X0 Maternal care for excessive fetal growth, third trimester, not applicable or unspecified: Secondary | ICD-10-CM | POA: Diagnosis present

## 2017-04-05 DIAGNOSIS — Z3A00 Weeks of gestation of pregnancy not specified: Secondary | ICD-10-CM | POA: Diagnosis not present

## 2017-04-05 LAB — URINALYSIS, ROUTINE W REFLEX MICROSCOPIC
Bilirubin Urine: NEGATIVE
Glucose, UA: NEGATIVE mg/dL
HGB URINE DIPSTICK: NEGATIVE
Ketones, ur: 20 mg/dL — AB
NITRITE: NEGATIVE
Protein, ur: NEGATIVE mg/dL
SPECIFIC GRAVITY, URINE: 1.014 (ref 1.005–1.030)
pH: 6 (ref 5.0–8.0)

## 2017-04-05 LAB — CBC WITH DIFFERENTIAL/PLATELET
Basophils Absolute: 0 10*3/uL (ref 0.0–0.1)
Basophils Relative: 0 %
Eosinophils Absolute: 0.2 10*3/uL (ref 0.0–0.7)
Eosinophils Relative: 1 %
HEMATOCRIT: 38.8 % (ref 36.0–46.0)
HEMOGLOBIN: 13.2 g/dL (ref 12.0–15.0)
LYMPHS ABS: 3.1 10*3/uL (ref 0.7–4.0)
Lymphocytes Relative: 21 %
MCH: 28.4 pg (ref 26.0–34.0)
MCHC: 34 g/dL (ref 30.0–36.0)
MCV: 83.6 fL (ref 78.0–100.0)
Monocytes Absolute: 0.6 10*3/uL (ref 0.1–1.0)
Monocytes Relative: 4 %
NEUTROS ABS: 10.6 10*3/uL — AB (ref 1.7–7.7)
Neutrophils Relative %: 74 %
Platelets: 214 10*3/uL (ref 150–400)
RBC: 4.64 MIL/uL (ref 3.87–5.11)
RDW: 14.5 % (ref 11.5–15.5)
WBC: 14.6 10*3/uL — AB (ref 4.0–10.5)

## 2017-04-05 LAB — COMPREHENSIVE METABOLIC PANEL
ALT: 17 U/L (ref 14–54)
AST: 20 U/L (ref 15–41)
Albumin: 3.3 g/dL — ABNORMAL LOW (ref 3.5–5.0)
Alkaline Phosphatase: 127 U/L — ABNORMAL HIGH (ref 38–126)
Anion gap: 10 (ref 5–15)
BUN: 6 mg/dL (ref 6–20)
CO2: 19 mmol/L — AB (ref 22–32)
Calcium: 8.9 mg/dL (ref 8.9–10.3)
Chloride: 106 mmol/L (ref 101–111)
Creatinine, Ser: 0.43 mg/dL — ABNORMAL LOW (ref 0.44–1.00)
GFR calc non Af Amer: >60 mL/min (ref >60)
Glucose, Bld: 87 mg/dL (ref 65–99)
Potassium: 3.7 mmol/L (ref 3.5–5.1)
SODIUM: 135 mmol/L (ref 135–145)
Total Bilirubin: 0.6 mg/dL (ref 0.3–1.2)
Total Protein: 6.7 g/dL (ref 6.5–8.1)

## 2017-04-05 LAB — TYPE AND SCREEN
ABO/RH(D): A POS
Antibody Screen: NEGATIVE

## 2017-04-05 LAB — PROTEIN / CREATININE RATIO, URINE
Creatinine, Urine: 115 mg/dL
PROTEIN CREATININE RATIO: 0.1 mg/mg{creat} (ref 0.00–0.15)
Total Protein, Urine: 11 mg/dL

## 2017-04-05 SURGERY — Surgical Case
Anesthesia: Spinal

## 2017-04-05 MED ORDER — LACTATED RINGERS IV BOLUS (SEPSIS)
1000.0000 mL | Freq: Once | INTRAVENOUS | Status: AC
  Administered 2017-04-05: 1000 mL via INTRAVENOUS

## 2017-04-05 MED ORDER — FENTANYL CITRATE (PF) 100 MCG/2ML IJ SOLN
INTRAMUSCULAR | Status: DC | PRN
  Administered 2017-04-05: 25 ug via INTRATHECAL

## 2017-04-05 MED ORDER — SOD CITRATE-CITRIC ACID 500-334 MG/5ML PO SOLN
30.0000 mL | Freq: Once | ORAL | Status: AC
  Administered 2017-04-05: 30 mL via ORAL
  Filled 2017-04-05: qty 15

## 2017-04-05 MED ORDER — SCOPOLAMINE 1 MG/3DAYS TD PT72
MEDICATED_PATCH | TRANSDERMAL | Status: AC
  Filled 2017-04-05: qty 1

## 2017-04-05 MED ORDER — KETOROLAC TROMETHAMINE 30 MG/ML IJ SOLN
INTRAMUSCULAR | Status: AC
  Filled 2017-04-05: qty 1

## 2017-04-05 MED ORDER — MORPHINE SULFATE (PF) 0.5 MG/ML IJ SOLN
INTRAMUSCULAR | Status: AC
  Filled 2017-04-05: qty 10

## 2017-04-05 MED ORDER — SCOPOLAMINE 1 MG/3DAYS TD PT72
MEDICATED_PATCH | TRANSDERMAL | Status: DC | PRN
  Administered 2017-04-05: 1 via TRANSDERMAL

## 2017-04-05 MED ORDER — OXYCODONE HCL 5 MG PO TABS
10.0000 mg | ORAL_TABLET | Freq: Once | ORAL | Status: DC
  Filled 2017-04-05: qty 2

## 2017-04-05 MED ORDER — FAMOTIDINE IN NACL 20-0.9 MG/50ML-% IV SOLN
20.0000 mg | Freq: Once | INTRAVENOUS | Status: AC
  Administered 2017-04-05: 20 mg via INTRAVENOUS
  Filled 2017-04-05: qty 50

## 2017-04-05 MED ORDER — MORPHINE SULFATE (PF) 0.5 MG/ML IJ SOLN
INTRAMUSCULAR | Status: DC | PRN
  Administered 2017-04-05: .1 mg via INTRATHECAL

## 2017-04-05 MED ORDER — LACTATED RINGERS IV SOLN
INTRAVENOUS | Status: DC | PRN
Start: 2017-04-05 — End: 2017-04-05
  Administered 2017-04-05 (×2): via INTRAVENOUS

## 2017-04-05 MED ORDER — ACETAMINOPHEN 500 MG PO TABS
ORAL_TABLET | ORAL | Status: AC
  Filled 2017-04-05: qty 2

## 2017-04-05 MED ORDER — LACTATED RINGERS IV SOLN: INTRAVENOUS | Status: DC

## 2017-04-05 MED ORDER — PHENYLEPHRINE 8 MG IN D5W 100 ML (0.08MG/ML) PREMIX OPTIME
INJECTION | INTRAVENOUS | Status: DC | PRN
  Administered 2017-04-05: 60 ug/min via INTRAVENOUS

## 2017-04-05 MED ORDER — SODIUM CHLORIDE 0.9 % IR SOLN
Status: DC | PRN
  Administered 2017-04-05: 1

## 2017-04-05 MED ORDER — FENTANYL CITRATE (PF) 100 MCG/2ML IJ SOLN
25.0000 ug | INTRAMUSCULAR | Status: DC | PRN
  Administered 2017-04-05 (×2): 50 ug via INTRAVENOUS

## 2017-04-05 MED ORDER — CLINDAMYCIN PHOSPHATE 900 MG/50ML IV SOLN
900.0000 mg | Freq: Once | INTRAVENOUS | Status: AC
  Administered 2017-04-05: 900 mg via INTRAVENOUS
  Filled 2017-04-05: qty 50

## 2017-04-05 MED ORDER — PHENYLEPHRINE 8 MG IN D5W 100 ML (0.08MG/ML) PREMIX OPTIME
INJECTION | INTRAVENOUS | Status: AC
  Filled 2017-04-05: qty 100

## 2017-04-05 MED ORDER — BUPIVACAINE IN DEXTROSE 0.75-8.25 % IT SOLN
INTRATHECAL | Status: AC
  Filled 2017-04-05: qty 2

## 2017-04-05 MED ORDER — FENTANYL CITRATE (PF) 100 MCG/2ML IJ SOLN
INTRAMUSCULAR | Status: AC
  Filled 2017-04-05: qty 2

## 2017-04-05 MED ORDER — ONDANSETRON HCL 4 MG/2ML IJ SOLN
INTRAMUSCULAR | Status: DC | PRN
Start: 2017-04-05 — End: 2017-04-05
  Administered 2017-04-05: 4 mg via INTRAVENOUS

## 2017-04-05 MED ORDER — OXYTOCIN 10 UNIT/ML IJ SOLN
INTRAVENOUS | Status: DC | PRN
  Administered 2017-04-05: 40 [IU] via INTRAVENOUS

## 2017-04-05 MED ORDER — LACTATED RINGERS IV SOLN
INTRAVENOUS | Status: DC | PRN
  Administered 2017-04-05: 21:00:00 via INTRAVENOUS

## 2017-04-05 MED ORDER — KETOROLAC TROMETHAMINE 30 MG/ML IJ SOLN
30.0000 mg | Freq: Once | INTRAMUSCULAR | Status: AC
  Administered 2017-04-05: 30 mg via INTRAMUSCULAR

## 2017-04-05 MED ORDER — ONDANSETRON HCL 4 MG/2ML IJ SOLN
INTRAMUSCULAR | Status: AC
  Filled 2017-04-05: qty 2

## 2017-04-05 MED ORDER — OXYTOCIN 10 UNIT/ML IJ SOLN
INTRAMUSCULAR | Status: AC
  Filled 2017-04-05: qty 4

## 2017-04-05 MED ORDER — ACETAMINOPHEN 500 MG PO TABS
1000.0000 mg | ORAL_TABLET | Freq: Once | ORAL | Status: AC
  Administered 2017-04-05: 1000 mg via ORAL

## 2017-04-05 SURGICAL SUPPLY — 34 items
ADH SKN CLS APL DERMABOND .7 (GAUZE/BANDAGES/DRESSINGS) ×1
CHLORAPREP W/TINT 26ML (MISCELLANEOUS) ×3 IMPLANT
CLAMP CORD UMBIL (MISCELLANEOUS) IMPLANT
CLOTH BEACON ORANGE TIMEOUT ST (SAFETY) ×3 IMPLANT
DERMABOND ADVANCED (GAUZE/BANDAGES/DRESSINGS) ×2
DERMABOND ADVANCED .7 DNX12 (GAUZE/BANDAGES/DRESSINGS) ×1 IMPLANT
DRSG OPSITE POSTOP 4X10 (GAUZE/BANDAGES/DRESSINGS) ×3 IMPLANT
ELECT REM PT RETURN 9FT ADLT (ELECTROSURGICAL) ×3
ELECTRODE REM PT RTRN 9FT ADLT (ELECTROSURGICAL) ×1 IMPLANT
EXTRACTOR VACUUM M CUP 4 TUBE (SUCTIONS) IMPLANT
EXTRACTOR VACUUM M CUP 4' TUBE (SUCTIONS)
GLOVE BIO SURGEON STRL SZ 6.5 (GLOVE) ×2 IMPLANT
GLOVE BIO SURGEONS STRL SZ 6.5 (GLOVE) ×1
GLOVE BIOGEL PI IND STRL 7.0 (GLOVE) ×2 IMPLANT
GLOVE BIOGEL PI INDICATOR 7.0 (GLOVE) ×4
GOWN STRL REUS W/TWL LRG LVL3 (GOWN DISPOSABLE) ×6 IMPLANT
HOVERMATT SINGLE USE (MISCELLANEOUS) ×2 IMPLANT
KIT ABG SYR 3ML LUER SLIP (SYRINGE) IMPLANT
NDL HYPO 25X5/8 SAFETYGLIDE (NEEDLE) IMPLANT
NEEDLE HYPO 25X5/8 SAFETYGLIDE (NEEDLE) IMPLANT
NS IRRIG 1000ML POUR BTL (IV SOLUTION) ×3 IMPLANT
PACK C SECTION WH (CUSTOM PROCEDURE TRAY) ×3 IMPLANT
PAD OB MATERNITY 4.3X12.25 (PERSONAL CARE ITEMS) ×3 IMPLANT
PENCIL SMOKE EVAC W/HOLSTER (ELECTROSURGICAL) ×3 IMPLANT
SUT CHROMIC 0 CT 802H (SUTURE) IMPLANT
SUT CHROMIC 0 CTX 36 (SUTURE) ×9 IMPLANT
SUT MON AB-0 CT1 36 (SUTURE) ×3 IMPLANT
SUT PDS AB 0 CTX 60 (SUTURE) ×3 IMPLANT
SUT PLAIN 0 NONE (SUTURE) IMPLANT
SUT PLAIN 3 0 XLH 27  52T (SUTURE) ×2 IMPLANT
SUT VIC AB 4-0 KS 27 (SUTURE) IMPLANT
SYR BULB 3OZ (MISCELLANEOUS) ×3 IMPLANT
TOWEL OR 17X24 6PK STRL BLUE (TOWEL DISPOSABLE) ×3 IMPLANT
TRAY FOLEY BAG SILVER LF 14FR (SET/KITS/TRAYS/PACK) IMPLANT

## 2017-04-05 NOTE — Consult Note (Signed)
Neonatology Note:   Attendance at C-section:    I was asked by Dr. Julien Girt to attend this primary C/S at 65 0/7 weeks due to gestational hypertension and fetal macrosomia. The mother is a G3P2 A pos, GBS positive with onset of hypertension. ROM at delivery, fluid clear. Vacuum-assisted delivery. Infant vigorous with good spontaneous cry and tone. Delayed cord clamping was done.  Needed only minimal bulb suctioning. Ap 8/9. Lungs clear to ausc in DR. To CN to care of Pediatrician.   Real Cons, MD

## 2017-04-05 NOTE — MAU Note (Signed)
Pt is a G3P2 at 37.0 weeks, c/o HA, increased swelling and increased BP.  Pt sent for labs and BP check by MD.

## 2017-04-05 NOTE — MAU Provider Note (Signed)
History   G3P2002 @ 37 wks in with c/o headache. States started Thursday. Has taken tylenol without relief. Also has dizziness and significant edema. Pt is scheduled for C/S and BTL for fetal macrosomia.  CSN: [id number]  Arrival date & time 04/05/17  1703   None     Chief Complaint  Patient presents with  . Headache  . Hypertension    HPI  Past Medical History:  Diagnosis Date  . Chronic kidney disease    chronic uti and kidney infections  . GERD (gastroesophageal reflux disease)     Past Surgical History:  Procedure Laterality Date  . ADENOIDECTOMY    . KNEE ARTHROSCOPY     x2  . MIDDLE EAR SURGERY     benign growth removed from eardrum  . TONSILLECTOMY      Family History  Problem Relation Age of Onset  . Anemia Mother   . Thyroid disease Mother   . Diabetes Mother   . COPD Maternal Grandmother   . Thyroid disease Maternal Grandmother   . COPD Maternal Grandfather   . Heart disease Paternal Grandfather   . Diabetes Paternal Grandfather   . Anesthesia problems Neg Hx   . Hypotension Neg Hx   . Malignant hyperthermia Neg Hx   . Pseudochol deficiency Neg Hx     Social History  Substance Use Topics  . Smoking status: Never Smoker  . Smokeless tobacco: Never Used  . Alcohol use No    OB History    Gravida Para Term Preterm AB Living   3 2 2     2    SAB TAB Ectopic Multiple Live Births           2      Review of Systems  Constitutional: Negative.   HENT: Negative.   Eyes: Negative.   Respiratory: Negative.   Cardiovascular: Positive for leg swelling.  Gastrointestinal: Negative.   Endocrine: Negative.   Genitourinary: Negative.   Musculoskeletal: Negative.   Skin: Negative.   Allergic/Immunologic: Negative.   Neurological: Positive for headaches.  Hematological: Negative.   Psychiatric/Behavioral: Negative.     Allergies  Colace [docusate calcium] and Amoxicillin  Home Medications    BP (!) 134/92   Pulse (!) 109   Temp 98.1 F  (36.7 C) (Oral)   LMP 07/20/2016 (Exact Date)   Physical Exam  Constitutional: She is oriented to person, place, and time. She appears well-developed and well-nourished.  HENT:  Head: Normocephalic.  Eyes: Pupils are equal, round, and reactive to light.  Neck: Normal range of motion.  Cardiovascular: Normal rate, regular rhythm, normal heart sounds and intact distal pulses.   Pulmonary/Chest: Effort normal and breath sounds normal.  Abdominal: Soft. Bowel sounds are normal.  Musculoskeletal: Normal range of motion. She exhibits edema.  Neurological: She is alert and oriented to person, place, and time. She has normal reflexes.  Skin: Skin is warm and dry.  Psychiatric: She has a normal mood and affect. Her behavior is normal. Judgment and thought content normal.    MAU Course  Procedures (including critical care time)  Labs Reviewed  COMPREHENSIVE METABOLIC PANEL  URINALYSIS, ROUTINE W REFLEX MICROSCOPIC  CBC WITH DIFFERENTIAL/PLATELET  PROTEIN / CREATININE RATIO, URINE   No results found.   No diagnosis found.    MDM  BP's 130's /90's, 4+ pitting edema. DTR's 1+ no clonus. FHR tattern reassuring with accels no decels. Having mild uc's q 3-4 min apart. [place] labs. Dr. Julien Girt given pt status report.  Decision made for c/s

## 2017-04-05 NOTE — Transfer of Care (Signed)
Immediate Anesthesia Transfer of Care Note  Patient: Elizabeth Foster  Procedure(s) Performed: CESAREAN SECTION WITH BILATERAL TUBAL LIGATION (N/A )  Patient Location: PACU  Anesthesia Type:Spinal  Level of Consciousness: awake  Airway & Oxygen Therapy: Patient Spontanous Breathing  Post-op Assessment: Report given to RN and Post -op Vital signs reviewed and stable  Post vital signs: stable  Last Vitals:  Vitals:   04/05/17 2004 04/05/17 2005  BP: (!) 144/91   Pulse: 99   Resp:  18  Temp:      Last Pain:  Vitals:   04/05/17 1728  TempSrc: Oral  PainSc: 6          Complications: No apparent anesthesia complications

## 2017-04-05 NOTE — Anesthesia Preprocedure Evaluation (Addendum)
Anesthesia Evaluation  Patient identified by MRN, date of birth, ID band Patient awake    Reviewed: Allergy & Precautions, H&P , Patient's Chart, lab work & pertinent test results, reviewed documented beta blocker date and time   Airway Mallampati: II  TM Distance: >3 FB Neck ROM: full    Dental no notable dental hx.    Pulmonary    Pulmonary exam normal breath sounds clear to auscultation       Cardiovascular  Rhythm:regular Rate:Normal     Neuro/Psych    GI/Hepatic   Endo/Other  Morbid obesity  Renal/GU      Musculoskeletal   Abdominal   Peds  Hematology   Anesthesia Other Findings   Reproductive/Obstetrics                             Anesthesia Physical Anesthesia Plan  ASA: III and emergent  Anesthesia Plan: Spinal   Post-op Pain Management:    Induction:   PONV Risk Score and Plan: 2 and Ondansetron, Dexamethasone and Treatment may vary due to age or medical condition  Airway Management Planned:   Additional Equipment:   Intra-op Plan:   Post-operative Plan:   Informed Consent: I have reviewed the patients History and Physical, chart, labs and discussed the procedure including the risks, benefits and alternatives for the proposed anesthesia with the patient or authorized representative who has indicated his/her understanding and acceptance.   Dental Advisory Given  Plan Discussed with: CRNA and Surgeon  Anesthesia Plan Comments: (  )       Anesthesia Quick Evaluation

## 2017-04-05 NOTE — Anesthesia Procedure Notes (Signed)
Spinal  Patient location during procedure: OR Preanesthetic Checklist Completed: patient identified, site marked, surgical consent, pre-op evaluation, timeout performed, IV checked, risks and benefits discussed and monitors and equipment checked Spinal Block Patient position: sitting Prep: DuraPrep Patient monitoring: cardiac monitor, continuous pulse ox, blood pressure and heart rate Approach: midline Location: L3-4 Injection technique: catheter Needle Needle type: Tuohy and Sprotte  Needle gauge: 24 G Needle length: 12.7 cm Needle insertion depth: 6 cm Catheter type: closed end flexible Catheter size: 19 g Additional Notes Spinal Dosage in OR  .75% Bupivicaine ml       1.4     [id number]   mcg        100    Fentanyl mcg            25

## 2017-04-05 NOTE — H&P (Signed)
Elizabeth Foster is a 31 y.o. female G3P2 @ 37 wks presenting for HA and elevated BP - 140s/90s.  Pt worked today and noted HA that was not improved w/ tylenol.  Good FM.  No ctx, vb or lof.  Pregnancy uncomplicated.  LGA by Ultrasond (97%)   OB History    Gravida Para Term Preterm AB Living   3 2 2     2    SAB TAB Ectopic Multiple Live Births           2     Past Medical History:  Diagnosis Date  . Chronic kidney disease    chronic uti and kidney infections  . GERD (gastroesophageal reflux disease)    Past Surgical History:  Procedure Laterality Date  . ADENOIDECTOMY    . KNEE ARTHROSCOPY     x2  . MIDDLE EAR SURGERY     benign growth removed from eardrum  . TONSILLECTOMY     Family History: family history includes Anemia in her mother; COPD in her maternal grandfather and maternal grandmother; Diabetes in her mother and paternal grandfather; Heart disease in her paternal grandfather; Thyroid disease in her maternal grandmother and mother. Social History:  reports that she has never smoked. She has never used smokeless tobacco. She reports that she does not drink alcohol or use drugs.     Maternal Diabetes: No Genetic Screening: Normal Maternal Ultrasounds/Referrals: Normal Fetal Ultrasounds or other Referrals:  None Maternal Substance Abuse:  No Significant Maternal Medications:  None Significant Maternal Lab Results:  None Other Comments:  None  ROS History   Blood pressure 137/84, pulse 96, temperature 98.1 F (36.7 C), temperature source Oral, height 5\' 4"  (1.626 m), weight 252 lb 12 oz (114.6 kg), last menstrual period 07/20/2016, unknown if currently breastfeeding. Exam Physical Exam  BP 130s/90s Gen - NAD Abd - gravid, NT Ext - 3+ pitting edema bilaterally 1+ DTR bilat CV - RRR Lungs - clear Prenatal labs: ABO, Rh:   Antibody:   Rubella:   RPR:    HBsAg:    HIV:    GBS:     Assessment/Plan: G3P2 @ 37+ weeks with gestational HTN and persistent  HA Requests primary c-section and BTL  R/b/a discussed, questions answered, informed consent   Izmael Duross 04/05/2017, 7:23 PM

## 2017-04-05 NOTE — Op Note (Signed)
Cesarean Section Procedure Note   Elizabeth Foster  04/05/2017  Indications: Gestational HTN, desires sterilization, requests c-section   Pre-operative Diagnosis: Gestational HTN, requests c-section and sterilization  Procedure:  c-section, bilateral partial salpingectomy  Surgeon: Surgeon(s) and Role:    Marylynn Pearson, MD - Primary   Assistants: none  Anesthesia: spinal   Procedure Details:  The patient was seen in the Holding Room. The risks, benefits, complications, treatment options, and expected outcomes were discussed with the patient. The patient concurred with the proposed plan, giving informed consent. identified as Chauncy Lean and the procedure verified as C-Section Delivery. A Time Out was held and the above information confirmed.  After induction of anesthesia, the patient was draped and prepped in the usual sterile manner. A transverse was made and carried down through the subcutaneous tissue to the fascia. Fascial incision was made and extended transversely. The fascia was separated from the underlying rectus tissue superiorly and inferiorly. The peritoneum was identified and entered. Peritoneal incision was extended longitudinally. The utero-vesical peritoneal reflection was incised transversely and the bladder flap was bluntly freed from the lower uterine segment. A low transverse uterine incision was made. Copious amount of clear amniotic fluid evacuated from the uterus.  Infant floated to fundus and brought back to incision with fundal pressure.  Vacuum applied and head delivered w/ gentle traction.  Cord ph was not sent the umbilical cord was clamped and cut cord blood was obtained for evaluation. The placenta was removed Intact and appeared normal. The uterine outline, tubes and ovaries appeared normal}. The uterine incision was closed with running locked sutures of 0chromic gut.   Hemostasis was observed. Lavage was carried out until clear.  Fallopian tube grasped  with a babcock.  Knuckle of tube doubly tied with plain gut suture.  Segment of tube excised and passed off.  Hemostasis noted and procedure repeated on opposite side.  Peritoneum closed with 0 monocryl. The fascia was then reapproximated with running sutures of 0PDS. The subcuticular closure was performed using 2-0plain gut. The skin was closed with 4-0Vicryl.   Instrument, sponge, and needle counts were correct prior the abdominal closure and were correct at the conclusion of the case.    Findings:   Estimated Blood Loss: approx 550cc (pending weight of sponges)  Urine Output: clear  Specimens: @ORSPECIMEN @   Complications: no complications  Disposition: PACU - hemodynamically stable.   Maternal Condition: stable   Baby condition / location:  Couplet care / Skin to Skin  Attending Attestation: I was present and scrubbed for the entire procedure.   Signed: Surgeon(s): Marylynn Pearson, MD

## 2017-04-06 ENCOUNTER — Encounter (HOSPITAL_COMMUNITY): Payer: Self-pay | Admitting: Obstetrics and Gynecology

## 2017-04-06 DIAGNOSIS — Z98891 History of uterine scar from previous surgery: Secondary | ICD-10-CM

## 2017-04-06 LAB — CBC
HEMATOCRIT: 33.7 % — AB (ref 36.0–46.0)
HEMOGLOBIN: 11.4 g/dL — AB (ref 12.0–15.0)
MCH: 28.3 pg (ref 26.0–34.0)
MCHC: 33.8 g/dL (ref 30.0–36.0)
MCV: 83.6 fL (ref 78.0–100.0)
Platelets: 195 10*3/uL (ref 150–400)
RBC: 4.03 MIL/uL (ref 3.87–5.11)
RDW: 14.3 % (ref 11.5–15.5)
WBC: 14 10*3/uL — ABNORMAL HIGH (ref 4.0–10.5)

## 2017-04-06 MED ORDER — MEASLES, MUMPS & RUBELLA VAC ~~LOC~~ INJ
0.5000 mL | INJECTION | Freq: Once | SUBCUTANEOUS | Status: DC
  Filled 2017-04-06: qty 0.5

## 2017-04-06 MED ORDER — IBUPROFEN 600 MG PO TABS
600.0000 mg | ORAL_TABLET | Freq: Four times a day (QID) | ORAL | Status: DC
  Administered 2017-04-06 – 2017-04-08 (×10): 600 mg via ORAL
  Filled 2017-04-06 (×10): qty 1

## 2017-04-06 MED ORDER — MEDROXYPROGESTERONE ACETATE 150 MG/ML IM SUSP: 150.0000 mg | INTRAMUSCULAR | Status: DC | PRN

## 2017-04-06 MED ORDER — COCONUT OIL OIL
1.0000 "application " | TOPICAL_OIL | Status: DC | PRN
  Administered 2017-04-06: 1 via TOPICAL
  Filled 2017-04-06: qty 120

## 2017-04-06 MED ORDER — OXYCODONE-ACETAMINOPHEN 5-325 MG PO TABS
2.0000 | ORAL_TABLET | ORAL | Status: DC | PRN
  Administered 2017-04-06 – 2017-04-08 (×10): 2 via ORAL
  Filled 2017-04-06 (×9): qty 2

## 2017-04-06 MED ORDER — DEXTROSE IN LACTATED RINGERS 5 % IV SOLN: INTRAVENOUS | Status: DC

## 2017-04-06 MED ORDER — DIPHENHYDRAMINE HCL 25 MG PO CAPS: 25.0000 mg | ORAL_CAPSULE | Freq: Four times a day (QID) | ORAL | Status: DC | PRN

## 2017-04-06 MED ORDER — DIBUCAINE 1 % RE OINT: 1.0000 "application " | TOPICAL_OINTMENT | RECTAL | Status: DC | PRN

## 2017-04-06 MED ORDER — PRENATAL MULTIVITAMIN CH
1.0000 | ORAL_TABLET | Freq: Every day | ORAL | Status: DC
  Administered 2017-04-06 – 2017-04-08 (×3): 1 via ORAL
  Filled 2017-04-06 (×3): qty 1

## 2017-04-06 MED ORDER — WITCH HAZEL-GLYCERIN EX PADS: 1.0000 "application " | MEDICATED_PAD | CUTANEOUS | Status: DC | PRN

## 2017-04-06 MED ORDER — TETANUS-DIPHTH-ACELL PERTUSSIS 5-2.5-18.5 LF-MCG/0.5 IM SUSP: 0.5000 mL | Freq: Once | INTRAMUSCULAR | Status: DC

## 2017-04-06 MED ORDER — SIMETHICONE 80 MG PO CHEW
80.0000 mg | CHEWABLE_TABLET | Freq: Three times a day (TID) | ORAL | Status: DC
  Administered 2017-04-06 – 2017-04-08 (×7): 80 mg via ORAL
  Filled 2017-04-06 (×7): qty 1

## 2017-04-06 MED ORDER — OXYCODONE-ACETAMINOPHEN 5-325 MG PO TABS
1.0000 | ORAL_TABLET | ORAL | Status: DC | PRN
  Administered 2017-04-06: 1 via ORAL
  Filled 2017-04-06 (×3): qty 1

## 2017-04-06 MED ORDER — SIMETHICONE 80 MG PO CHEW
80.0000 mg | CHEWABLE_TABLET | ORAL | Status: DC | PRN
Start: 2017-04-06 — End: 2017-04-08

## 2017-04-06 MED ORDER — ACETAMINOPHEN 325 MG PO TABS: 650.0000 mg | ORAL_TABLET | ORAL | Status: DC | PRN

## 2017-04-06 MED ORDER — OXYTOCIN 40 UNITS IN LACTATED RINGERS INFUSION - SIMPLE MED: 2.5000 [IU]/h | INTRAVENOUS | Status: AC

## 2017-04-06 MED ORDER — SIMETHICONE 80 MG PO CHEW
80.0000 mg | CHEWABLE_TABLET | ORAL | Status: DC
  Administered 2017-04-06 – 2017-04-08 (×3): 80 mg via ORAL
  Filled 2017-04-06 (×2): qty 1

## 2017-04-06 MED ORDER — SENNOSIDES-DOCUSATE SODIUM 8.6-50 MG PO TABS: 2.0000 | ORAL_TABLET | ORAL | Status: DC

## 2017-04-06 MED ORDER — MENTHOL 3 MG MT LOZG: 1.0000 | LOZENGE | OROMUCOSAL | Status: DC | PRN

## 2017-04-06 NOTE — Progress Notes (Signed)
Patient told to ambulate halls . Patient told to use her incentive spirometer. Plan of care told to patient as well as pain regimen.

## 2017-04-06 NOTE — Anesthesia Postprocedure Evaluation (Deleted)
Anesthesia Post Note  Patient: Elizabeth Foster  Procedure(s) Performed: CESAREAN SECTION WITH BILATERAL TUBAL LIGATION (N/A )     Patient location during evaluation: Mother Baby Anesthesia Type: Spinal Level of consciousness: awake and alert, oriented and patient cooperative Pain management: pain level controlled Vital Signs Assessment: post-procedure vital signs reviewed and stable Respiratory status: spontaneous breathing Cardiovascular status: stable Postop Assessment: no headache, epidural receding, patient able to bend at knees and no signs of nausea or vomiting Anesthetic complications: no Comments: Pain score 5.  Pt just received motrin for pain.    Last Vitals:  Vitals:   04/06/17 0200 04/06/17 0602  BP: (!) 105/48 (!) 106/52  Pulse: 82 78  Resp: 20 18  Temp: 36.9 C 36.9 C  SpO2: 95% 97%    Last Pain:  Vitals:   04/06/17 0602  TempSrc: Oral  PainSc: 4    Pain Goal: Patients Stated Pain Goal: 3 (04/06/17 0200)               [name]

## 2017-04-06 NOTE — Progress Notes (Signed)
Subjective: Postpartum Day 1: Cesarean Delivery Patient reports incisional pain, tolerating PO and no problems voiding.  HA resolved.  Requests circ  Objective: Vital signs in last 24 hours: Temp:  [98.1 F (36.7 C)-99.2 F (37.3 C)] 98.4 F (36.9 C) (11/04 0602) Pulse Rate:  [73-115] 78 (11/04 0602) Resp:  [16-26] 18 (11/04 0602) BP: (102-144)/(48-92) 106/52 (11/04 0602) SpO2:  [95 %-100 %] 97 % (11/04 0602) Weight:  [252 lb 12 oz (114.6 kg)] 252 lb 12 oz (114.6 kg) (11/03 1921)  Physical Exam:  General: alert and cooperative Lochia: appropriate Uterine Fundus: firm Incision: healing well, no significant drainage DVT Evaluation: No evidence of DVT seen on physical exam.  Recent Labs    04/05/17 1735 04/06/17 0520  HGB 13.2 11.4*  HCT 38.8 33.7*    Assessment/Plan: Status post Cesarean section. Doing well postoperatively.  Continue current care. R/b/a circ discussed, informed consent  Staton Markey 04/06/2017, 9:43 AM

## 2017-04-06 NOTE — Addendum Note (Signed)
Addendum  created 04/06/17 0745 by Garner Nash, CRNA   Sign clinical note

## 2017-04-06 NOTE — Lactation Note (Signed)
This note was copied from a baby's chart. Lactation Consultation Note Experienced BF mom of 7 months to her now 31 yr old. Mom's first child now 72 yrs old didn't latch and was not BF. Mom [employer] Employee. Has flat nipples, very compressible. Mom states baby able to obtain a deep latch. Denied pain.  Hand expressed colostrum. D/t mom having flat nipples, shells given to wear if breast becomes not compressible when filling to assist nipple to evert. Gave instructions. Mom asked for DEBP, brought one to rm to be set up later. Hand pump given.  Reviewed newborn behavior, positioning for BF, STS, I&O, cluster feeding, support, and props. Mom encouraged to feed baby 8-12 times/24 hours and with feeding cues.  Encouraged mom to call for questions or assistance. [place] brochure given w/resources, support groups and [place] services.  Patient Name: Elizabeth Foster ZYSAY'[id number] Date: 04/06/2017 Reason for consult: Initial assessment;Early term 37-38.6wks   Maternal Data Has patient been taught Hand Expression?: Yes Does the patient have breastfeeding experience prior to this delivery?: Yes  Feeding Feeding Type: Breast Fed  LATCH Score Latch: Grasps breast easily, tongue down, lips flanged, rhythmical sucking.  Audible Swallowing: A few with stimulation  Type of Nipple: Flat  Comfort (Breast/Nipple): Soft / non-tender  Hold (Positioning): No assistance needed to correctly position infant at breast.  LATCH Score: 9  Interventions Interventions: Breast feeding basics reviewed;Breast compression;Shells;Skin to skin;Breast massage;Support pillows;Hand pump;Hand express;Position options;DEBP;Expressed milk;Pre-pump if needed  Lactation Tools Discussed/Used Tools: Shells;Pump Shell Type: Inverted Breast pump type: Double-Electric Breast Pump;Manual WIC Program: No Pump Review: (left in rm.) Initiated by:: Ferd Glassing IBCLC    Consult Status Consult Status: Follow-up Date: 04/06/17(in  afternoon) Follow-up type: In-patient    Cheyeanne Roadcap, Elta Guadeloupe 04/06/2017, 6:06 AM

## 2017-04-06 NOTE — Anesthesia Postprocedure Evaluation (Signed)
Anesthesia Post Note  Patient: Elizabeth Foster  Procedure(s) Performed: CESAREAN SECTION WITH BILATERAL TUBAL LIGATION (N/A )     Patient location during evaluation: PACU Anesthesia Type: Spinal Level of consciousness: awake Pain management: satisfactory to patient Vital Signs Assessment: post-procedure vital signs reviewed and stable Respiratory status: spontaneous breathing Cardiovascular status: blood pressure returned to baseline Postop Assessment: no headache and spinal receding Anesthetic complications: no    Last Vitals:  Vitals:   04/05/17 2315 04/05/17 2330  BP: 109/62 106/63  Pulse: 94 76  Resp: (!) 21 20  Temp:  36.7 C  SpO2: 98%     Last Pain:  Vitals:   04/05/17 2330  TempSrc: Oral  PainSc:    Pain Goal: Patients Stated Pain Goal: 1 (04/05/17 2200)               Lyndle Herrlich EDWARD

## 2017-04-06 NOTE — Addendum Note (Signed)
Addendum  created 04/06/17 0752 by Garner Nash, CRNA   Delete clinical note, Sign clinical note

## 2017-04-06 NOTE — Progress Notes (Signed)
Hypoactive BS, Enc pt to ambulate in hall

## 2017-04-06 NOTE — Anesthesia Postprocedure Evaluation (Signed)
Anesthesia Post Note  Patient: Elizabeth Foster  Procedure(s) Performed: CESAREAN SECTION WITH BILATERAL TUBAL LIGATION (N/A )     Patient location during evaluation: Mother Baby Anesthesia Type: Spinal Level of consciousness: awake and alert, oriented and patient cooperative Pain management: pain level controlled Vital Signs Assessment: post-procedure vital signs reviewed and stable Respiratory status: spontaneous breathing Cardiovascular status: stable Postop Assessment: no headache, patient able to bend at knees, no signs of nausea or vomiting and spinal receding Anesthetic complications: no Comments: Pain score 5.  Pt had just received motrin.      Last Vitals:  Vitals:   04/06/17 0200 04/06/17 0602  BP: (!) 105/48 (!) 106/52  Pulse: 82 78  Resp: 20 18  Temp: 36.9 C 36.9 C  SpO2: 95% 97%    Last Pain:  Vitals:   04/06/17 0602  TempSrc: Oral  PainSc: 4    Pain Goal: Patients Stated Pain Goal: 3 (04/06/17 0200)               [name]

## 2017-04-06 NOTE — Progress Notes (Signed)
Dr. Mardene Sayer called and stated patient could be Hep welled without the D5 LR if patient is eating and drinking fine.

## 2017-04-06 NOTE — Plan of Care (Signed)
Tolerated OOB well. Diet progressed.

## 2017-04-07 MED ORDER — POLYETHYLENE GLYCOL 3350 17 G PO PACK
17.0000 g | PACK | Freq: Every day | ORAL | Status: DC | PRN
  Filled 2017-04-07: qty 1

## 2017-04-07 MED ORDER — SENNOSIDES-DOCUSATE SODIUM 8.6-50 MG PO TABS
1.0000 | ORAL_TABLET | Freq: Every evening | ORAL | Status: DC | PRN
  Administered 2017-04-07 – 2017-04-08 (×2): 1 via ORAL
  Filled 2017-04-07: qty 1

## 2017-04-07 NOTE — Plan of Care (Signed)
Pt. Up and walking and tol. Well pt. Eating and tol. well

## 2017-04-07 NOTE — Lactation Note (Signed)
This note was copied from a baby's chart. Lactation Consultation Note  Patient Name: Elizabeth Foster [id number] Date: 04/07/2017 Reason for consult: Follow-up assessment;Early term 37-38.6wks;Hyperbilirubinemia   Follow up with mom of 61 hour old early term infant. Infant with 4 BF for 10-30 minutes, 5 BF attempts, formula x 4 of 9-30 cc, 2 voids and 6 stools in last 24 hours. LATCH scores 8. Infant weight 7 lb 15 oz with 6% weight loss since birth. Infant under single phototherapy.   Mom reports infant has been sleepy and is under phototherapy and is not as active with feeding. Discussed early term feeding behavior and sleepiness with jaundice. Mom reports he tends to latch better if they give him a little supplement before latch, enc mom to hand express/pump and give EBM if sleepy to assist with latch. Mom is offering supplement of formula after BF.   Mom is starting to pump. She was pumping one breast when LC Entered room, enc mom to pump both breasts for better hormonal stimulation, reviewed pump settings and pumping about every 2-3 hours post BF for 15 minutes on Initiate setting.   Plan reviewed with mom and written on white board in the room  Enc mom to offer breast at least every 3 hours and with feeding cues Offer appetizer of EBM as needed to awaken infant to feed Supplement infant with EBM/formula per supplementation guidelines Pump for 15 minutes on Initiate setting after BF Follow pumping with hand expression  Mom is a [employer] employee and wants a Medela Free Style pump for home, mom will call once she gets her insurance card out of the car. Mom reports she has no questions/concerns at this time. Enc mom to call out for feeding assistance as needed.    Maternal Data Formula Feeding for Exclusion: No Has patient been taught Hand Expression?: Yes Does the patient have breastfeeding experience prior to this delivery?: Yes  Feeding Feeding Type: Formula Nipple Type: Slow -  flow  LATCH Score                   Interventions    Lactation Tools Discussed/Used WIC Program: No Pump Review: Setup, frequency, and cleaning;Milk Storage Initiated by:: Reviewed and ecncouraged every 2-3 hours post BF   Consult Status Consult Status: Follow-up Date: 04/08/17 Follow-up type: In-patient    Debby Freiberg Zakeya Junker 04/07/2017, 2:33 PM

## 2017-04-07 NOTE — Progress Notes (Signed)
Subjective: Postpartum Day 2: Cesarean Delivery Patient reports tolerating PO, + flatus and no problems voiding.    Objective: Vital signs in last 24 hours: Temp:  [97.6 F (36.4 C)-98.2 F (36.8 C)] 98.1 F (36.7 C) (11/05 0551) Pulse Rate:  [78-82] 78 (11/05 0551) Resp:  [18-20] 20 (11/05 0551) BP: (96-105)/(50-56) 96/56 (11/05 0551) SpO2:  [98 %] 98 % (11/05 0551)  Physical Exam:  General: alert, cooperative, appears stated age and no distress Lochia: appropriate Uterine Fundus: firm Incision: healing well DVT Evaluation: No evidence of DVT seen on physical exam.  Recent Labs    04/05/17 1735 04/06/17 0520  HGB 13.2 11.4*  HCT 38.8 33.7*    Assessment/Plan: Status post Cesarean section. Doing well postoperatively.  Continue current care.  Wirt Hemmerich C 04/07/2017, 9:27 AM

## 2017-04-08 LAB — RPR: RPR Ser Ql: NONREACTIVE

## 2017-04-08 MED ORDER — OXYCODONE HCL 5 MG PO TABS: 5.0000 mg | ORAL_TABLET | Freq: Four times a day (QID) | ORAL | 0 refills | Status: DC | PRN

## 2017-04-08 MED ORDER — IBUPROFEN 600 MG PO TABS: 600.0000 mg | ORAL_TABLET | Freq: Four times a day (QID) | ORAL | 0 refills | Status: DC | PRN

## 2017-04-08 NOTE — Discharge Summary (Signed)
Obstetric Discharge Summary Reason for Admission: onset of labor Prenatal Procedures: none Intrapartum Procedures: cesarean: low cervical, transverse Postpartum Procedures: none Complications-Operative and Postpartum: none Hemoglobin  Date Value Ref Range Status  04/06/2017 11.4 (L) 12.0 - 15.0 g/dL Final  05/17/2015 13.5 11.1 - 15.9 g/dL Final   HCT  Date Value Ref Range Status  04/06/2017 33.7 (L) 36.0 - 46.0 % Final   Hematocrit  Date Value Ref Range Status  05/17/2015 39.6 34.0 - 46.6 % Final    Physical Exam:  General: alert, cooperative and no distress Lochia: appropriate Uterine Fundus: firm Incision: healing well DVT Evaluation: No evidence of DVT seen on physical exam.  Discharge Diagnoses: Term Pregnancy-delivered  Discharge Information: Date: 04/08/2017 Activity: pelvic rest Diet: routine Medications: PNV, Ibuprofen and oxycodone Condition: stable Instructions: refer to practice specific booklet Discharge to: home   Newborn Data: Live born female  Birth Weight: 8 lb 7.3 oz (3836 g) APGAR: 8, 9  Newborn Delivery   Birth date/time:  04/05/2017 20:47:00 Delivery type:  C-Section, Vacuum Assisted C-section categorization:  Primary     Home with mother.  Elizabeth Foster II,Elizabeth Foster 04/08/2017, 8:34 AM

## 2017-04-08 NOTE — Lactation Note (Signed)
This note was copied from a baby's chart. Lactation Consultation Note: Mother reports that breastfeeding is going well. She reports that she feels her breast getting heavy. Mother denies having any discomfort with latch and reports she hears infant swallow. Mother reports that she had a low milk supply with the last child. She is concerned that this may happen again. Mother enquiring about supplements to increase milk supply. Discussion on the use of oatmeal as a galactoguge and the use of fenugreek as a supplement. Advised mother to continue to post pump until milk comes to volume and infant is gaining well. Advised mother to continue to protect milk supply with additional pumping 1-2 times daily. Advised mother in treatment and prevention of engorgement. Reviewed S/S of Mastitis. Advised mother to cue base feed infant and at least 8-12 times in 24 hours. Mother to page for [facility] when husband gets back with her insurance information so she can get her Medela FreeStyle pump.   Patient Name: Elizabeth Foster [id number] Date: 04/08/2017 Reason for consult: Follow-up assessment   Maternal Data    Feeding Feeding Type: Breast Fed  LATCH Score Latch: Grasps breast easily, tongue down, lips flanged, rhythmical sucking.  Audible Swallowing: A few with stimulation  Type of Nipple: Everted at rest and after stimulation  Comfort (Breast/Nipple): Soft / non-tender  Hold (Positioning): Full assist, staff holds infant at breast  [facility] Score: 7  Interventions    Lactation Tools Discussed/Used     Consult Status      Darla Lesches 04/08/2017, 10:58 AM

## 2017-04-19 ENCOUNTER — Inpatient Hospital Stay (HOSPITAL_COMMUNITY): Admission: AD | Admit: 2017-04-19 | Payer: 59 | Source: Ambulatory Visit | Admitting: Obstetrics and Gynecology

## 2017-04-19 ENCOUNTER — Inpatient Hospital Stay (HOSPITAL_COMMUNITY): Payer: 59

## 2017-04-19 SURGERY — Surgical Case
Anesthesia: Regional

## 2017-05-14 DIAGNOSIS — Z1389 Encounter for screening for other disorder: Secondary | ICD-10-CM | POA: Diagnosis not present

## 2017-05-19 ENCOUNTER — Telehealth: Payer: 59 | Admitting: Family

## 2017-05-19 DIAGNOSIS — J028 Acute pharyngitis due to other specified organisms: Secondary | ICD-10-CM | POA: Diagnosis not present

## 2017-05-19 DIAGNOSIS — B9689 Other specified bacterial agents as the cause of diseases classified elsewhere: Secondary | ICD-10-CM

## 2017-05-19 MED ORDER — DOXYCYCLINE HYCLATE 100 MG PO TABS: 100.0000 mg | ORAL_TABLET | Freq: Two times a day (BID) | ORAL | 0 refills | Status: DC

## 2017-05-19 NOTE — Progress Notes (Signed)
Thank you for the details you included in the comment boxes. Those details are very helpful in determining the best course of treatment for you and help [place] to provide the best care. Both meds below are safe for breastfeeding.   We are sorry that you are not feeling well.  Here is how we plan to help!  Based on your presentation I believe you most likely have A cough due to bacteria.  When patients have a fever and a productive cough with a change in color or increased sputum production, we are concerned about bacterial bronchitis.  If left untreated it can progress to pneumonia.  If your symptoms do not improve with your treatment plan it is important that you contact your provider.   I have prescribed Doxycycline 100 mg twice a day for 7 days     In addition you may use A non-prescription cough medication called Mucinex DM: take 2 tablets every 12 hours. (this is safe during breastfeeding), the DM, not the "D". You want the one with Dextromethorphan, not with stimulants.      From your responses in the eVisit questionnaire you describe inflammation in the upper respiratory tract which is causing a significant cough.  This is commonly called Bronchitis and has four common causes:    Allergies  Viral Infections  Acid Reflux  Bacterial Infection Allergies, viruses and acid reflux are treated by controlling symptoms or eliminating the cause. An example might be a cough caused by taking certain blood pressure medications. You stop the cough by changing the medication. Another example might be a cough caused by acid reflux. Controlling the reflux helps control the cough.  USE OF BRONCHODILATOR ("RESCUE") INHALERS: There is a risk from using your bronchodilator too frequently.  The risk is that over-reliance on a medication which only relaxes the muscles surrounding the breathing tubes can reduce the effectiveness of medications prescribed to reduce swelling and congestion of the tubes themselves.   Although you feel brief relief from the bronchodilator inhaler, your asthma may actually be worsening with the tubes becoming more swollen and filled with mucus.  This can delay other crucial treatments, such as oral steroid medications. If you need to use a bronchodilator inhaler daily, several times per day, you should discuss this with your provider.  There are probably better treatments that could be used to keep your asthma under control.     HOME CARE . Only take medications as instructed by your medical team. . Complete the entire course of an antibiotic. . Drink plenty of fluids and get plenty of rest. . Avoid close contacts especially the very young and the elderly . Cover your mouth if you cough or cough into your sleeve. . Always remember to wash your hands . A steam or ultrasonic humidifier can help congestion.   GET HELP RIGHT AWAY IF: . You develop worsening fever. . You become short of breath . You cough up blood. . Your symptoms persist after you have completed your treatment plan MAKE SURE YOU   Understand these instructions.  Will watch your condition.  Will get help right away if you are not doing well or get worse.  Your e-visit answers were reviewed by a board certified advanced clinical practitioner to complete your personal care plan.  Depending on the condition, your plan could have included both over the counter or prescription medications. If there is a problem please reply  once you have received a response from your provider. Your safety  is important to [place].  If you have drug allergies check your prescription carefully.    You can use MyChart to ask questions about today's visit, request a non-urgent call back, or ask for a work or school excuse for 24 hours related to this e-Visit. If it has been greater than 24 hours you will need to follow up with your provider, or enter a new e-Visit to address those concerns. You will get an e-mail in the next two days  asking about your experience.  I hope that your e-visit has been valuable and will speed your recovery. Thank you for using e-visits.

## 2017-07-13 ENCOUNTER — Telehealth (HOSPITAL_COMMUNITY): Payer: Self-pay | Admitting: Obstetrics and Gynecology

## 2017-07-19 NOTE — Telephone Encounter (Signed)
Please see note.

## 2017-08-19 DIAGNOSIS — M9902 Segmental and somatic dysfunction of thoracic region: Secondary | ICD-10-CM | POA: Diagnosis not present

## 2017-08-25 DIAGNOSIS — M9902 Segmental and somatic dysfunction of thoracic region: Secondary | ICD-10-CM | POA: Diagnosis not present

## 2017-09-01 DIAGNOSIS — M9902 Segmental and somatic dysfunction of thoracic region: Secondary | ICD-10-CM | POA: Diagnosis not present

## 2017-09-10 ENCOUNTER — Telehealth: Payer: 59 | Admitting: Nurse Practitioner

## 2017-09-10 DIAGNOSIS — H10023 Other mucopurulent conjunctivitis, bilateral: Secondary | ICD-10-CM | POA: Diagnosis not present

## 2017-09-10 DIAGNOSIS — B308 Other viral conjunctivitis: Secondary | ICD-10-CM | POA: Diagnosis not present

## 2017-09-10 DIAGNOSIS — H5789 Other specified disorders of eye and adnexa: Secondary | ICD-10-CM

## 2017-09-10 MED ORDER — POLYMYXIN B-TRIMETHOPRIM 10000-0.1 UNIT/ML-% OP SOLN: 2.0000 [drp] | OPHTHALMIC | 0 refills | Status: DC

## 2017-09-10 NOTE — Progress Notes (Signed)

## 2017-09-12 DIAGNOSIS — H10023 Other mucopurulent conjunctivitis, bilateral: Secondary | ICD-10-CM | POA: Diagnosis not present

## 2017-09-12 DIAGNOSIS — H1133 Conjunctival hemorrhage, bilateral: Secondary | ICD-10-CM | POA: Diagnosis not present

## 2017-09-12 DIAGNOSIS — B308 Other viral conjunctivitis: Secondary | ICD-10-CM | POA: Diagnosis not present

## 2017-10-14 DIAGNOSIS — D485 Neoplasm of uncertain behavior of skin: Secondary | ICD-10-CM | POA: Diagnosis not present

## 2017-10-14 DIAGNOSIS — Z1283 Encounter for screening for malignant neoplasm of skin: Secondary | ICD-10-CM | POA: Diagnosis not present

## 2017-10-14 DIAGNOSIS — L918 Other hypertrophic disorders of the skin: Secondary | ICD-10-CM | POA: Diagnosis not present

## 2017-10-14 DIAGNOSIS — B078 Other viral warts: Secondary | ICD-10-CM | POA: Diagnosis not present

## 2017-10-14 DIAGNOSIS — D225 Melanocytic nevi of trunk: Secondary | ICD-10-CM | POA: Diagnosis not present

## 2017-10-15 ENCOUNTER — Other Ambulatory Visit: Payer: Self-pay | Admitting: Family Medicine

## 2017-10-15 ENCOUNTER — Encounter: Payer: Self-pay | Admitting: Family Medicine

## 2017-10-15 ENCOUNTER — Ambulatory Visit (INDEPENDENT_AMBULATORY_CARE_PROVIDER_SITE_OTHER): Payer: 59 | Admitting: Family Medicine

## 2017-10-15 VITALS — BP 120/80 | Ht 64.0 in | Wt 254.2 lb

## 2017-10-15 DIAGNOSIS — R635 Abnormal weight gain: Secondary | ICD-10-CM

## 2017-10-15 DIAGNOSIS — E785 Hyperlipidemia, unspecified: Secondary | ICD-10-CM | POA: Diagnosis not present

## 2017-10-15 DIAGNOSIS — Z0000 Encounter for general adult medical examination without abnormal findings: Secondary | ICD-10-CM | POA: Diagnosis not present

## 2017-10-15 MED ORDER — MUPIROCIN 2 % EX OINT: TOPICAL_OINTMENT | CUTANEOUS | 0 refills | Status: DC

## 2017-10-15 NOTE — Progress Notes (Addendum)
   Subjective:    Patient ID: Elizabeth Foster, female    DOB: March 01, 1986, 32 y.o.   MRN: [id number]  HPI The patient comes in today for a wellness visit.  Wellness exam Sees gynecology for female checkup Denies any major health issues Tries to eat healthy trying to lose weight She is interested in doing some blood test   A review of their health history was completed.  A review of medications was also completed.  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   Additional concerns: spots on abdomen and pt would like to have Quantiferum tb  Gold assay  Denies being depressed Review of Systems  Constitutional: Negative for activity change, appetite change and fatigue.  HENT: Negative for congestion and rhinorrhea.   Eyes: Negative for discharge.  Respiratory: Negative for cough, chest tightness and wheezing.   Cardiovascular: Negative for chest pain.  Gastrointestinal: Negative for abdominal pain, blood in stool and vomiting.  Endocrine: Negative for polyphagia.  Genitourinary: Negative for difficulty urinating and frequency.  Musculoskeletal: Negative for neck pain.  Skin: Negative for color change.  Allergic/Immunologic: Negative for environmental allergies and food allergies.  Neurological: Negative for weakness and headaches.  Psychiatric/Behavioral: Negative for agitation and behavioral problems.       Objective:   Physical Exam  Constitutional: She is oriented to person, place, and time. She appears well-developed and well-nourished.  HENT:  Head: Normocephalic.  Right Ear: External ear normal.  Left Ear: External ear normal.  Eyes: Pupils are equal, round, and reactive to light.  Neck: Normal range of motion. No thyromegaly present.  Cardiovascular: Normal rate,  regular rhythm, normal heart sounds and intact distal pulses.  No murmur heard. Pulmonary/Chest: Effort normal and breath sounds normal. No respiratory distress. She has no wheezes.  Abdominal: Soft. Bowel sounds are normal. She exhibits no distension and no mass. There is no tenderness.  Musculoskeletal: Normal range of motion. She exhibits no edema or tenderness.  Lymphadenopathy:    She has no cervical adenopathy.  Neurological: She is alert and oriented to person, place, and time. She exhibits normal muscle tone.  Skin: Skin is warm and dry.  Psychiatric: She has a normal mood and affect. Her behavior is normal.    Patient had a couple abnormal moles removed she has some erythema around it due to the Polysporin therefore changed to [place]:  Adult wellness-complete.wellness physical was conducted today. Importance of diet and exercise were discussed in detail.  In addition to this a discussion regarding safety was also covered. We also reviewed over immunizations and gave recommendations regarding current immunization needed for age.  In addition to this additional areas were also touched on including: Preventative health exams needed: Colonoscopy not indicated  Patient was advised yearly wellness exam Lab work ordered due to fatigue weight gain and patient requests

## 2017-10-16 LAB — GLUCOSE, RANDOM: GLUCOSE: 90 mg/dL (ref 65–99)

## 2017-10-16 LAB — LIPID PANEL
CHOL/HDL RATIO: 3.6 ratio (ref 0.0–4.4)
Cholesterol, Total: 192 mg/dL (ref 100–199)
HDL: 54 mg/dL (ref >39)
LDL CALC: 120 mg/dL — AB (ref 0–99)
TRIGLYCERIDES: 92 mg/dL (ref 0–149)
VLDL Cholesterol Cal: 18 mg/dL (ref 5–40)

## 2017-10-16 LAB — TSH: TSH: 1.23 u[IU]/mL (ref 0.450–4.500)

## 2017-10-16 LAB — T4, FREE: Free T4: 1.2 ng/dL (ref 0.82–1.77)

## 2017-10-20 LAB — QUANTIFERON-TB GOLD PLUS
QUANTIFERON NIL VALUE: 0.08 [IU]/mL
QuantiFERON TB1 Ag Value: 0.07 IU/mL
QuantiFERON TB2 Ag Value: 0.09 IU/mL
QuantiFERON-TB Gold Plus: NEGATIVE

## 2017-10-26 ENCOUNTER — Telehealth: Payer: 59 | Admitting: Family

## 2017-10-26 DIAGNOSIS — J020 Streptococcal pharyngitis: Secondary | ICD-10-CM | POA: Diagnosis not present

## 2017-10-26 MED ORDER — AZITHROMYCIN 250 MG PO TABS
ORAL_TABLET | ORAL | 0 refills | Status: DC
Start: 2017-10-26 — End: 2018-08-06

## 2017-10-26 NOTE — Progress Notes (Signed)
We are sorry that you are not feeling well.  Here is how we plan to help!  Based on what you have shared with me it is likely that you have strep pharyngitis.  Strep pharyngitis is inflammation and infection in the back of the throat.  This is an infection cause by bacteria and is treated with antibiotics.  I have prescribed a Zpak, Take 500 mg once, then 250 mg for four days.  For throat pain, we recommend over the counter oral pain relief medications such as acetaminophen or aspirin, or anti-inflammatory medications such as ibuprofen or naproxen sodium. Topical treatments such as oral throat lozenges or sprays may be used as needed. Strep infections are not as easily transmitted as other respiratory infections, however we still recommend that you avoid close contact with loved ones, especially the very young and elderly.  Remember to wash your hands thoroughly throughout the day as this is the number one way to prevent the spread of infection and wipe down door knobs and counters with disinfectant.   Home Care:  Only take medications as instructed by your medical team.  Complete the entire course of an antibiotic.  Do not take thesemedications with alcohol.  A steam or ultrasonic humidifier can help congestion.  You can place a towel over your head and breathe in the steam from hot water coming from a faucet.  Avoid close contacts especially the very young and the elderly.  Cover your mouth when you cough or sneeze.  Always remember to wash your hands.  Get Help Right Away If:  You develop worsening fever or sinus pain.  You develop a severe head ache or visual changes.  Your symptoms persist after you have completed your treatment plan.  Make sure you  Understand these instructions.  Will watch your condition.  Will get help right away if you are not doing well or get worse.  Your e-visit answers were reviewed by a board certified advanced clinical practitioner to complete  your personal care plan.  Depending on the condition, your plan could have included both over the counter or prescription medications.  If there is a problem please reply  once you have received a response from your provider.  Your safety is important to [place].  If you have drug allergies check your prescription carefully.    You can use MyChart to ask questions about today's visit, request a non-urgent call back, or ask for a work or school excuse for 24 hours related to this e-Visit. If it has been greater than 24 hours you will need to follow up with your provider, or enter a new e-Visit to address those concerns.  You will get an e-mail in the next two days asking about your experience.  I hope that your e-visit has been valuable and will speed your recovery. Thank you for using e-visits.

## 2017-12-03 ENCOUNTER — Encounter: Payer: Self-pay | Admitting: Family Medicine

## 2017-12-15 ENCOUNTER — Encounter: Payer: Self-pay | Admitting: Family Medicine

## 2017-12-16 ENCOUNTER — Other Ambulatory Visit: Payer: Self-pay | Admitting: *Deleted

## 2017-12-16 MED ORDER — PHENTERMINE HCL 37.5 MG PO CAPS: 37.5000 mg | ORAL_CAPSULE | ORAL | 1 refills | Status: DC

## 2017-12-16 NOTE — Telephone Encounter (Signed)
Please let the patient know that I am willing to prescribe phenteramine 37.5 mg 1 daily taken in the morning, #30, may have 1 refill.  I do recommend weight watchers as she stated and recommend follow-up if any ongoing issues

## 2018-02-23 DIAGNOSIS — H52223 Regular astigmatism, bilateral: Secondary | ICD-10-CM | POA: Diagnosis not present

## 2018-02-23 DIAGNOSIS — H5213 Myopia, bilateral: Secondary | ICD-10-CM | POA: Diagnosis not present

## 2018-04-03 ENCOUNTER — Encounter: Payer: Self-pay | Admitting: Family Medicine

## 2018-06-11 ENCOUNTER — Encounter: Payer: Self-pay | Admitting: Family Medicine

## 2018-06-11 DIAGNOSIS — M549 Dorsalgia, unspecified: Secondary | ICD-10-CM

## 2018-06-12 NOTE — Telephone Encounter (Signed)
May have referral as requested  Also message her via my chart please- should she need other intervention she will need to do a follow-up Also yearly office visit necessary to review over any other healthcare needs thank you

## 2018-06-15 NOTE — Addendum Note (Signed)
Addended by: Dairl Ponder on: 06/15/2018 08:45 AM   Modules accepted: Orders

## 2018-08-06 ENCOUNTER — Telehealth: Payer: No Typology Code available for payment source | Admitting: Physician Assistant

## 2018-08-06 DIAGNOSIS — Z20818 Contact with and (suspected) exposure to other bacterial communicable diseases: Secondary | ICD-10-CM | POA: Diagnosis not present

## 2018-08-06 DIAGNOSIS — J020 Streptococcal pharyngitis: Secondary | ICD-10-CM | POA: Diagnosis not present

## 2018-08-06 MED ORDER — AZITHROMYCIN 250 MG PO TABS: ORAL_TABLET | ORAL | 0 refills | Status: DC

## 2018-08-06 NOTE — Progress Notes (Signed)
I have spent 5 minutes in review of e-visit questionnaire, review and updating patient chart, medical decision making and response to patient.   Teigen Parslow Cody Aarian Griffie, PA-C    

## 2018-08-06 NOTE — Progress Notes (Signed)

## 2018-11-13 ENCOUNTER — Encounter: Payer: Self-pay | Admitting: Nurse Practitioner

## 2018-11-13 ENCOUNTER — Ambulatory Visit (INDEPENDENT_AMBULATORY_CARE_PROVIDER_SITE_OTHER): Payer: No Typology Code available for payment source | Admitting: Nurse Practitioner

## 2018-11-13 ENCOUNTER — Other Ambulatory Visit: Payer: Self-pay

## 2018-11-13 DIAGNOSIS — F411 Generalized anxiety disorder: Secondary | ICD-10-CM | POA: Diagnosis not present

## 2018-11-13 DIAGNOSIS — R1011 Right upper quadrant pain: Secondary | ICD-10-CM

## 2018-11-13 DIAGNOSIS — F430 Acute stress reaction: Secondary | ICD-10-CM | POA: Diagnosis not present

## 2018-11-13 MED ORDER — SERTRALINE HCL 50 MG PO TABS: ORAL_TABLET | ORAL | 0 refills | Status: DC

## 2018-11-13 NOTE — Progress Notes (Signed)
Subjective:    Patient ID: Elizabeth Foster, female    DOB: 1986-01-13, 33 y.o.   MRN: [id number]  HPI RLQ pain for about 2 weeks  Virtual Visit via Video Note  I connected with Elizabeth Foster on 11/13/18 at  9:40 AM EDT by a video enabled telemedicine application and verified that I am speaking with the correct person using two identifiers.  Location: Patient:home Provider: office   I discussed the limitations of evaluation and management by telemedicine and the availability of in person appointments. The patient expressed understanding and agreed to proceed.  History of Present Illness: See below.   Observations/Objective: Format  Patient present at home Provider present at office Consent for interaction obtained Coronavirus outbreak made virtual visit necessary    Subjective: Presents for complaints of right upper quadrant abdominal pain for the past 2 weeks.  Describes it as a dull cramp with occasional sharp pain.  Points to the area just beneath the right mid rib area/right upper quadrant of the abdomen as the area of her discomfort.  Minimal relief with Tylenol.  No fever, nausea vomiting, no constipation or diarrhea.  States pain comes and goes "in waves".  Has not identified any aggravating or alleviating factors.  Unassociated with meals or certain foods.  Worse with lying down.  Has had tubal ligation for birth control.  Same sexual partner.  Cycles regular, slightly heavier flow.  No pelvic pain.  No vaginal discharge.  No urinary symptoms.  Occasional reflux, does not take daily medication.  No pain radiating into the back or shoulder area. Also expressing an exacerbation of some anxiety symptoms particularly for the past couple of months.  Denies suicidal or homicidal thoughts or ideation. GAD 7 : Generalized Anxiety Score 11/13/2018  Nervous, Anxious, on Edge 3  Control/stop worrying 2  Worry too much - different things 2  Trouble relaxing 3  Restless 0  Easily  annoyed or irritable 1  Afraid - awful might happen 0  Total GAD 7 Score 11  Anxiety Difficulty Not difficult at all   Assessment and Plan:   ICD-10-CM   1. Right upper quadrant abdominal pain  R10.11 US Abdomen Limited RUQ    CBC with Differential    Hepatic function panel    Basic Metabolic Panel (BMET)    Lipase  2. Anxiety as acute reaction to exceptional stress  F41.1    F43.0    Meds ordered this encounter  Medications  . sertraline (ZOLOFT) 50 MG tablet    Sig: Take 1/2 tab po qd x 5-7 d then increase to one tab po qd    Dispense:  30 tablet    Refill:  0    Order Specific Question:   Supervising Provider    Answer:   Sallee Lange A [[id number]]     Follow Up Instructions:  Labs and abdominal ultrasound pending.  Warning signs reviewed including fever worsening abdominal pain vomiting.  Go to ED over the weekend if symptoms worsen.  Otherwise further follow-up based on her test results.  As a precaution avoid fatty foods at this time.  Start Zoloft as directed.  Reviewed potential adverse effects.  DC med and call if any problems.  Follow-up in 1 month for reassessment.   I discussed the assessment and treatment plan with the patient. The patient was provided an opportunity to ask questions and all were answered. The patient agreed with the plan and demonstrated an understanding of the instructions.  The patient was advised to call back or seek an in-person evaluation if the symptoms worsen or if the condition fails to improve as anticipated.  I provided 15 minutes of non-face-to-face time during this encounter.    Review of Systems     Objective:   Physical Exam        Assessment & Plan:

## 2018-11-14 ENCOUNTER — Encounter: Payer: Self-pay | Admitting: Nurse Practitioner

## 2018-11-14 LAB — LIPASE: Lipase: 19 U/L (ref 14–72)

## 2018-11-14 LAB — CBC WITH DIFFERENTIAL/PLATELET
Basophils Absolute: 0.1 10*3/uL (ref 0.0–0.2)
Basos: 1 %
EOS (ABSOLUTE): 0.3 10*3/uL (ref 0.0–0.4)
Eos: 3 %
Hematocrit: 43.8 % (ref 34.0–46.6)
Hemoglobin: 14.2 g/dL (ref 11.1–15.9)
Immature Grans (Abs): 0 10*3/uL (ref 0.0–0.1)
Immature Granulocytes: 0 %
Lymphocytes Absolute: 2.8 10*3/uL (ref 0.7–3.1)
Lymphs: 27 %
MCH: 28.3 pg (ref 26.6–33.0)
MCHC: 32.4 g/dL (ref 31.5–35.7)
MCV: 87 fL (ref 79–97)
Monocytes Absolute: 0.6 10*3/uL (ref 0.1–0.9)
Monocytes: 6 %
Neutrophils Absolute: 6.6 10*3/uL (ref 1.4–7.0)
Neutrophils: 63 %
Platelets: 301 10*3/uL (ref 150–450)
RBC: 5.01 x10E6/uL (ref 3.77–5.28)
RDW: 13.4 % (ref 11.7–15.4)
WBC: 10.4 10*3/uL (ref 3.4–10.8)

## 2018-11-14 LAB — HEPATIC FUNCTION PANEL
ALT: 12 IU/L (ref 0–32)
AST: 16 IU/L (ref 0–40)
Albumin: 4.8 g/dL (ref 3.8–4.8)
Alkaline Phosphatase: 72 IU/L (ref 39–117)
Bilirubin Total: 0.4 mg/dL (ref 0.0–1.2)
Bilirubin, Direct: 0.12 mg/dL (ref 0.00–0.40)
Total Protein: 7.8 g/dL (ref 6.0–8.5)

## 2018-11-14 LAB — BASIC METABOLIC PANEL
BUN/Creatinine Ratio: 12 (ref 9–23)
BUN: 9 mg/dL (ref 6–20)
CO2: 20 mmol/L (ref 20–29)
Calcium: 9.8 mg/dL (ref 8.7–10.2)
Chloride: 102 mmol/L (ref 96–106)
Creatinine, Ser: 0.74 mg/dL (ref 0.57–1.00)
GFR calc Af Amer: 123 mL/min/{1.73_m2} (ref >59)
GFR calc non Af Amer: 107 mL/min/{1.73_m2} (ref >59)
Glucose: 79 mg/dL (ref 65–99)
Potassium: 4.3 mmol/L (ref 3.5–5.2)
Sodium: 138 mmol/L (ref 134–144)

## 2018-11-20 ENCOUNTER — Other Ambulatory Visit: Payer: Self-pay

## 2018-11-20 ENCOUNTER — Ambulatory Visit (HOSPITAL_COMMUNITY)
Admission: RE | Admit: 2018-11-20 | Discharge: 2018-11-20 | Disposition: A | Discharge status: Home / Self Care | Payer: No Typology Code available for payment source | Source: Ambulatory Visit | Attending: Nurse Practitioner | Admitting: Nurse Practitioner

## 2018-11-20 DIAGNOSIS — R1011 Right upper quadrant pain: Secondary | ICD-10-CM | POA: Insufficient documentation

## 2018-11-24 ENCOUNTER — Ambulatory Visit (HOSPITAL_COMMUNITY): Payer: 59

## 2018-12-11 ENCOUNTER — Telehealth: Payer: Self-pay | Admitting: Family Medicine

## 2018-12-11 NOTE — Telephone Encounter (Signed)
Please advise. Thank you

## 2018-12-11 NOTE — Telephone Encounter (Signed)
PATIENT CALLED BACK AND WANTS RX SENT TO [facility] INSTEAD OF [facility] BECAUSE THEY WILL MAIL IT TO HER.

## 2018-12-11 NOTE — Telephone Encounter (Signed)
Pt is requesting refill on sertraline (ZOLOFT) 50 MG tablet  [id number] - [place], [place] - [address] AT [address]  July 27 pt has follow up with Hoyle Sauer. Pharmacy gave her 4 tablets today to get through weekend.

## 2018-12-14 MED ORDER — SERTRALINE HCL 50 MG PO TABS: ORAL_TABLET | ORAL | 0 refills | Status: DC

## 2018-12-14 MED FILL — SERTRALINE HCL 50 MG TABLET: 50 | 32 days supply | Qty: 30 | Fill #0

## 2018-12-14 NOTE — Telephone Encounter (Addendum)
Prescription sent electronically to pharmacy. Patient notified. 

## 2018-12-14 NOTE — Telephone Encounter (Signed)
Ok six mo 

## 2018-12-24 ENCOUNTER — Other Ambulatory Visit: Payer: Self-pay

## 2018-12-28 ENCOUNTER — Ambulatory Visit (INDEPENDENT_AMBULATORY_CARE_PROVIDER_SITE_OTHER): Payer: No Typology Code available for payment source | Admitting: Nurse Practitioner

## 2018-12-28 ENCOUNTER — Encounter: Payer: Self-pay | Admitting: Nurse Practitioner

## 2018-12-28 ENCOUNTER — Other Ambulatory Visit: Payer: Self-pay

## 2018-12-28 DIAGNOSIS — R5383 Other fatigue: Secondary | ICD-10-CM

## 2018-12-28 DIAGNOSIS — F411 Generalized anxiety disorder: Secondary | ICD-10-CM

## 2018-12-28 DIAGNOSIS — I83891 Varicose veins of right lower extremities with other complications: Secondary | ICD-10-CM | POA: Insufficient documentation

## 2018-12-28 DIAGNOSIS — G2581 Restless legs syndrome: Secondary | ICD-10-CM | POA: Diagnosis not present

## 2018-12-28 DIAGNOSIS — F430 Acute stress reaction: Secondary | ICD-10-CM

## 2018-12-28 MED ORDER — SERTRALINE HCL 50 MG PO TABS: ORAL_TABLET | ORAL | 1 refills | Status: DC

## 2018-12-28 NOTE — Progress Notes (Signed)
Subjective:  Virtual visit  Patient ID: Elizabeth Foster, female    DOB: April 09, 1986, 33 y.o.   MRN: [id number]  HPI  Patient calls for a follow up on Zoloft. Patient states she is doing well with the medication. Patient would also like to discuss restless legs and trouble sleeping at night.   Virtual Visit via Video Note  I connected with Elizabeth Foster on 12/28/18 at 11:00 AM EDT by a video enabled telemedicine application and verified that I am speaking with the correct person using two identifiers.  Location: Patient: home Provider: office   I discussed the limitations of evaluation and management by telemedicine and the availability of in person appointments. The patient expressed understanding and agreed to proceed.  History of Present Illness: Presents for recheck on her anxiety.  Much improved on Zoloft 50 mg.  All of her symptoms have improved, states she has gone from a 9 for anxiety down to a 4/5.  Her main issue continues to be sleep but this is complicated by restless leg syndrome.  States she cannot keep her legs still has to get up and move around which delays her getting to sleep.  Has large multiple varicose veins mainly in the right upper leg that began after her first pregnancy and has worsened over time.  Causes discomfort.  Swelling especially when she is worked for 12 hours.  There is a family history of RLS with her mother.  Note she is denying any further abdominal pain see previous note.  Has tried Tylenol PM for sleep but does not seem to be working as well lately.  Cannot take anything too sedating due to her children.   Observations/Objective: NAD.  Alert, oriented.  Thoughts logical coherent and relevant.  Dressed appropriately.  Cheerful affect.  Making good eye contact.  Multiple large ropey slightly purple varicose veins noted on the right thigh area.  Assessment and Plan: Problem List Items Addressed This Visit      Cardiovascular and Mediastinum    Varicose veins of leg with swelling, right   Relevant Orders   Ambulatory referral to Vascular Surgery     Other   Restless leg syndrome - Primary   Relevant Orders   Ferritin    Other Visit Diagnoses    Anxiety as acute reaction to exceptional stress       Fatigue, unspecified type       Relevant Orders   Ferritin     Meds ordered this encounter  Medications  . sertraline (ZOLOFT) 50 MG tablet    Sig: Take 1/2 tab po qd x 5-7 d then increase to one tab po qd    Dispense:  90 tablet    Refill:  1    Order Specific Question:   Supervising Provider    Answer:   Sallee Lange A [[id number]]      Follow Up Instructions: Continue Zoloft as directed. Ferritin ordered due to symptoms of RLS. Refer to vein clinic for further evaluation and treatment. Return in about 6 months (around 06/30/2019) for recheck. Call back sooner if any problems.   I discussed the assessment and treatment plan with the patient. The patient was provided an opportunity to ask questions and all were answered. The patient agreed with the plan and demonstrated an understanding of the instructions.   The patient was advised to call back or seek an in-person evaluation if the symptoms worsen or if the condition fails to improve as anticipated.  I  provided 15 minutes of non-face-to-face time during this encounter.      Review of Systems     Objective:   Physical Exam        Assessment & Plan:

## 2018-12-31 LAB — FERRITIN: Ferritin: 69 ng/mL (ref 15–150)

## 2019-01-01 ENCOUNTER — Telehealth: Payer: Self-pay | Admitting: Nurse Practitioner

## 2019-01-01 ENCOUNTER — Encounter: Payer: Self-pay | Admitting: Nurse Practitioner

## 2019-01-01 ENCOUNTER — Other Ambulatory Visit: Payer: Self-pay | Admitting: Nurse Practitioner

## 2019-01-01 MED ORDER — PRAMIPEXOLE DIHYDROCHLORIDE 0.125 MG PO TABS: ORAL_TABLET | ORAL | 0 refills | Status: DC

## 2019-01-01 NOTE — Telephone Encounter (Signed)
Patient had virtual visit on Monday but still having restless leg issues and she increased her tynelo pm for the next 3 nights but hasnt helped. She is requesting something to be called into [facility]

## 2019-01-01 NOTE — Telephone Encounter (Signed)
Med sent in with message in my chart.

## 2019-01-06 ENCOUNTER — Encounter: Payer: Self-pay | Admitting: Family Medicine

## 2019-01-06 MED FILL — SERTRALINE HCL 50 MG TABLET: 50 | 90 days supply | Qty: 90 | Fill #0

## 2019-01-08 ENCOUNTER — Telehealth: Payer: Self-pay | Admitting: Family Medicine

## 2019-01-08 NOTE — Telephone Encounter (Signed)
[facility]/[place]

## 2019-01-08 NOTE — Telephone Encounter (Signed)
Patient advised per Dr Richardson Landry: So, no, answer for both we do not treat family members of older folks with c diff, unless protracted symtoms and diarrhea., would not test based on a few short days of symptoms, woud not treat either one. Patient verbalized understanding.

## 2019-01-08 NOTE — Telephone Encounter (Signed)
So, no, answer for both we do not treat fam membwrs of older folks with c diff, unless protracted symtoms and diarrhea., would not test based o a few short days of symtoms, woud not treat either one

## 2019-01-08 NOTE — Telephone Encounter (Signed)
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

## 2019-01-28 ENCOUNTER — Telehealth: Payer: Self-pay | Admitting: Nurse Practitioner

## 2019-01-28 NOTE — Telephone Encounter (Signed)
Was prescribed this,(pramipexole (MIRAPEX) 0.125 MG),  for restless leg and it helped at first but now doesn't seem to be helping a lot.  Patient said Hoyle Sauer said to call back if it didn't work and she may increase the strength of the medicine.  Patient informed Hoyle Sauer will be here tomorrow.    [facility] [address]

## 2019-01-29 ENCOUNTER — Other Ambulatory Visit: Payer: Self-pay | Admitting: Nurse Practitioner

## 2019-01-29 ENCOUNTER — Encounter: Payer: Self-pay | Admitting: Nurse Practitioner

## 2019-01-29 MED ORDER — PRAMIPEXOLE DIHYDROCHLORIDE 0.25 MG PO TABS: ORAL_TABLET | ORAL | 2 refills | Status: DC

## 2019-02-26 MED FILL — PRAMIPEXOLE 0.25 MG TABLET: 0.25 | 30 days supply | Qty: 30 | Fill #0

## 2019-03-06 ENCOUNTER — Encounter: Payer: Self-pay | Admitting: Family Medicine

## 2019-03-15 ENCOUNTER — Other Ambulatory Visit: Payer: Self-pay

## 2019-03-15 DIAGNOSIS — I83891 Varicose veins of right lower extremities with other complications: Secondary | ICD-10-CM

## 2019-03-17 ENCOUNTER — Other Ambulatory Visit: Payer: Self-pay

## 2019-03-17 ENCOUNTER — Ambulatory Visit (INDEPENDENT_AMBULATORY_CARE_PROVIDER_SITE_OTHER): Payer: No Typology Code available for payment source | Admitting: Vascular Surgery

## 2019-03-17 ENCOUNTER — Ambulatory Visit (HOSPITAL_COMMUNITY)
Admission: RE | Admit: 2019-03-17 | Discharge: 2019-03-17 | Disposition: A | Discharge status: Home / Self Care | Payer: No Typology Code available for payment source | Source: Ambulatory Visit | Attending: Vascular Surgery | Admitting: Vascular Surgery

## 2019-03-17 ENCOUNTER — Encounter: Payer: Self-pay | Admitting: Vascular Surgery

## 2019-03-17 VITALS — BP 134/86 | HR 86 | Temp 97.9°F | Resp 20 | Ht 64.0 in | Wt 255.0 lb

## 2019-03-17 DIAGNOSIS — I83811 Varicose veins of right lower extremities with pain: Secondary | ICD-10-CM | POA: Diagnosis not present

## 2019-03-17 DIAGNOSIS — I83891 Varicose veins of right lower extremities with other complications: Secondary | ICD-10-CM | POA: Insufficient documentation

## 2019-03-17 NOTE — Progress Notes (Signed)
REASON FOR CONSULT:    Varicose veins right lower extremity with right leg swelling.  The consult is requested by Dr. Sallee Lange.  ASSESSMENT & PLAN:   CHRONIC VENOUS INSUFFICIENCY: This patient has some large varicose veins along the anterior lateral aspect of her right thigh.  I looked myself with the SonoSite and I do not see an anterior accessory saphenous vein which feeds these.  I suspect these are coming off of a branch of the saphenofemoral junction or possibly the proximal right great saphenous vein.  Currently the great saphenous vein does have reflux proximally but is not especially dilated.  I have recommended thigh-high or pantyhose compression stockings with a gradient of 15 to 20 mmHg.  If her symptoms do not improve then I think we could consider tighter stockings (20 to 30 mmHg).  We have also discussed the importance of intermittent leg elevation and the proper positioning for this.  In addition I have encouraged her to avoid prolonged sitting and standing.  We discussed the importance of exercise specifically walking and water aerobics.  We also discussed the importance of weight management as central obesity especially increases lower extremity venous pressure.  If her symptoms progress then I think we should try thigh-high compression stockings with a higher pressure gradient.  If this is not successful she might potentially be a candidate for laser ablation of the proximal right great saphenous vein with stab phlebectomies of the verrucous veins of the right thigh.  Deitra Mayo, MD, FACS Beeper [phone] Office: [phone]   HPI:   Elizabeth Foster is a pleasant 33 y.o. female, who presents with painful varicose veins of the right lower extremity.  She has had a long history of varicose veins of the right leg to begin with her pregnancy many years ago.  After her second pregnancy these worsened.  She describes multiple symptoms associated with her varicose veins.  The  symptoms include aching, throbbing, heaviness, burning, itching, and fatigue.  Her symptoms are aggravated by standing and sitting and relieved with elevation.  She has worn knee-high compression stockings without much relief.  She takes ibuprofen as needed for pain.  Leg elevation does help some.  She is unaware of any previous history of DVT or phlebitis.  Past Medical History:  Diagnosis Date  . Allergy   . Chronic kidney disease    chronic uti and kidney infections  . GERD (gastroesophageal reflux disease)     Family History  Problem Relation Age of Onset  . Anemia Mother   . Thyroid disease Mother   . Diabetes Mother   . COPD Maternal Grandmother   . Thyroid disease Maternal Grandmother   . COPD Maternal Grandfather   . Heart disease Paternal Grandfather   . Diabetes Paternal Grandfather   . Anesthesia problems Neg Hx   . Hypotension Neg Hx   . Malignant hyperthermia Neg Hx   . Pseudochol deficiency Neg Hx     SOCIAL HISTORY: Social History   Socioeconomic History  . Marital status: Married    Spouse name: Not on file  . Number of children: Not on file  . Years of education: Not on file  . Highest education level: Not on file  Occupational History  . Not on file  Social Needs  . Financial resource strain: Not on file  . Food insecurity    Worry: Not on file    Inability: Not on file  . Transportation needs    Medical: Not  on file    Non-medical: Not on file  Tobacco Use  . Smoking status: Never Smoker  . Smokeless tobacco: Never Used  Substance and Sexual Activity  . Alcohol use: No  . Drug use: No  . Sexual activity: Yes    Birth control/protection: None  Lifestyle  . Physical activity    Days per week: Not on file    Minutes per session: Not on file  . Stress: Not on file  Relationships  . Social Herbalist on phone: Not on file    Gets together: Not on file    Attends religious service: Not on file    Active member of club or  organization: Not on file    Attends meetings of clubs or organizations: Not on file    Relationship status: Not on file  . Intimate partner violence    Fear of current or ex partner: Not on file    Emotionally abused: Not on file    Physically abused: Not on file    Forced sexual activity: Not on file  Other Topics Concern  . Not on file  Social History Narrative  . Not on file    Allergies  Allergen Reactions  . Colace [Docusate Calcium] Other (See Comments)    Chest tight, nausea, vomiting, sweating  . Polytrim [Polymyxin B-Trimethoprim]     Blister on eye and eye was swollen  . Amoxicillin Rash    PT CAN TAKE KEFLEX WITH NO PROBLEMS PER PT AND HER PRENATAL RECORD Has patient had a PCN reaction causing immediate rash, facial/tongue/throat swelling, SOB or lightheadedness with hypotension: Yes Has patient had a PCN reaction causing severe rash involving mucus membranes or skin necrosis: No Has patient had a PCN reaction that required hospitalization: No Has patient had a PCN reaction occurring within the last 10 years: No If all of the above answers are "NO", then may proceed with Cephalospori    Current Outpatient Medications  Medication Sig Dispense Refill  . acetaminophen (TYLENOL) 325 MG tablet Take 650 mg every 6 (six) hours as needed by mouth for moderate pain.    . Cranberry 1000 MG CAPS Take by mouth.    . fexofenadine (ALLEGRA ODT) 30 MG disintegrating tablet Take 30 mg by mouth daily.    . fluticasone (FLONASE) 50 MCG/ACT nasal spray Place into both nostrils daily.    . Multiple Vitamin (MULTIVITAMIN) capsule Take 1 capsule by mouth daily.    . naproxen sodium (ALEVE) 220 MG tablet Take 220 mg by mouth as needed.    . Omega-3 Fatty Acids (FISH OIL) 1000 MG CAPS Take by mouth.    . pramipexole (MIRAPEX) 0.25 MG tablet Take one tab po 1-2 hours before bedtime for restless legs 30 tablet 2  . sertraline (ZOLOFT) 50 MG tablet Take 1/2 tab po qd x 5-7 d then increase to  one tab po qd 90 tablet 1   No current facility-administered medications for this visit.     REVIEW OF SYSTEMS:  [X]  denotes positive finding, [ ]  denotes negative finding Cardiac  Comments:  Chest pain or chest pressure:    Shortness of breath upon exertion:    Short of breath when lying flat:    Irregular heart rhythm:        Vascular    Pain in calf, thigh, or hip brought on by ambulation:    Pain in feet at night that wakes you up from your sleep:  Blood clot in your veins:    Leg swelling:  x       Pulmonary    Oxygen at home:    Productive cough:     Wheezing:         Neurologic    Sudden weakness in arms or legs:     Sudden numbness in arms or legs:     Sudden onset of difficulty speaking or slurred speech:    Temporary loss of vision in one eye:     Problems with dizziness:         Gastrointestinal    Blood in stool:     Vomited blood:         Genitourinary    Burning when urinating:     Blood in urine:        Psychiatric    Major depression:         Hematologic    Bleeding problems:    Problems with blood clotting too easily:        Skin    Rashes or ulcers:        Constitutional    Fever or chills:     PHYSICAL EXAM:   Vitals:   03/17/19 1419  BP: 134/86  Pulse: 86  Resp: 20  Temp: 97.9 F (36.6 C)  SpO2: 99%  Weight: 255 lb (115.7 kg)  Height: 5\' 4"  (1.626 m)    GENERAL: The patient is a well-nourished female, in no acute distress. The vital signs are documented above. CARDIAC: There is a regular rate and rhythm.  VASCULAR: I do not detect carotid bruits. She has palpable pedal pulses bilaterally. She does have a large cluster of varicose veins along the anterolateral aspect of her right thigh.  I did look at the great saphenous vein myself with the SonoSite.  The vein is not especially dilated in the proximal thigh where there is reflux.  I do not see a clearly identifiable anterior accessory saphenous vein. PULMONARY: There is  good air exchange bilaterally without wheezing or rales. ABDOMEN: Soft and non-tender with normal pitched bowel sounds.  MUSCULOSKELETAL: There are no major deformities or cyanosis. NEUROLOGIC: No focal weakness or paresthesias are detected. SKIN: There are no ulcers or rashes noted. PSYCHIATRIC: The patient has a normal affect.  DATA:    VENOUS DUPLEX: I have independently interpreted her venous duplex scan of the left lower extremity.  There is no evidence of DVT.  There is no superficial venous thrombosis.  There is deep venous reflux involving the common femoral vein.  There is superficial venous reflux involving the saphenofemoral junction and proximal right great saphenous vein.  The vein is not especially dilated.

## 2019-03-30 MED FILL — PRAMIPEXOLE 0.25 MG TABLET: 0.25 | 30 days supply | Qty: 30 | Fill #1

## 2019-03-30 MED FILL — SERTRALINE HCL 50 MG TABLET: 50 | 90 days supply | Qty: 90 | Fill #1

## 2019-04-19 ENCOUNTER — Encounter: Payer: Self-pay | Admitting: Nurse Practitioner

## 2019-04-21 ENCOUNTER — Other Ambulatory Visit: Payer: Self-pay | Admitting: Nurse Practitioner

## 2019-04-23 ENCOUNTER — Other Ambulatory Visit: Payer: Self-pay | Admitting: Nurse Practitioner

## 2019-04-23 MED ORDER — PRAMIPEXOLE DIHYDROCHLORIDE 0.5 MG PO TABS: ORAL_TABLET | ORAL | 1 refills | Status: DC

## 2019-04-23 MED FILL — PRAMIPEXOLE 0.5 MG TABLET: 0.5 | 90 days supply | Qty: 90 | Fill #0

## 2019-06-18 ENCOUNTER — Encounter: Payer: Self-pay | Admitting: Nurse Practitioner

## 2019-06-24 ENCOUNTER — Other Ambulatory Visit: Payer: Self-pay | Admitting: Nurse Practitioner

## 2019-06-24 MED ORDER — SERTRALINE HCL 50 MG PO TABS: ORAL_TABLET | ORAL | 0 refills | Status: DC

## 2019-06-24 MED FILL — SERTRALINE HCL 50 MG TABLET: 50 | 90 days supply | Qty: 135 | Fill #0

## 2019-07-01 ENCOUNTER — Ambulatory Visit (INDEPENDENT_AMBULATORY_CARE_PROVIDER_SITE_OTHER): Payer: No Typology Code available for payment source | Admitting: Family Medicine

## 2019-07-01 ENCOUNTER — Other Ambulatory Visit: Payer: Self-pay

## 2019-07-01 ENCOUNTER — Encounter (INDEPENDENT_AMBULATORY_CARE_PROVIDER_SITE_OTHER): Payer: Self-pay | Admitting: Family Medicine

## 2019-07-01 VITALS — BP 114/77 | HR 70 | Temp 98.3°F | Ht 64.0 in | Wt 267.0 lb

## 2019-07-01 DIAGNOSIS — R0602 Shortness of breath: Secondary | ICD-10-CM | POA: Diagnosis not present

## 2019-07-01 DIAGNOSIS — R5383 Other fatigue: Secondary | ICD-10-CM | POA: Diagnosis not present

## 2019-07-01 DIAGNOSIS — F3289 Other specified depressive episodes: Secondary | ICD-10-CM | POA: Diagnosis not present

## 2019-07-01 DIAGNOSIS — E7849 Other hyperlipidemia: Secondary | ICD-10-CM

## 2019-07-01 DIAGNOSIS — Z9189 Other specified personal risk factors, not elsewhere classified: Secondary | ICD-10-CM

## 2019-07-01 DIAGNOSIS — Z0289 Encounter for other administrative examinations: Secondary | ICD-10-CM

## 2019-07-01 DIAGNOSIS — F419 Anxiety disorder, unspecified: Secondary | ICD-10-CM

## 2019-07-01 DIAGNOSIS — Z6841 Body Mass Index (BMI) 40.0 and over, adult: Secondary | ICD-10-CM

## 2019-07-01 NOTE — Progress Notes (Signed)
Chief Complaint:   OBESITY Elizabeth Foster (MR# [id number]) is a 34 y.o. female who presents for evaluation and treatment of obesity and related comorbidities. Current BMI is Body mass index is 45.83 kg/m.Marland Kitchen Elizabeth Foster has been struggling with her weight for many years and has been unsuccessful in either losing weight, maintaining weight loss, or reaching her healthy weight goal.  Elizabeth Foster is currently in the action stage of change and ready to dedicate time achieving and maintaining a healthier weight. Elizabeth Foster is interested in becoming our patient and working on intensive lifestyle modifications including (but not limited to) diet and exercise for weight loss.  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  Depression Screen Elizabeth Foster's Food and Mood (modified PHQ-9) score was moderately positive.  Depression screen Elizabeth Foster 2/9 07/01/2019  Decreased Interest 3  Down, Depressed, Hopeless 1  PHQ - 2 Score 4  Altered sleeping 0  Tired, decreased energy 3  Change in appetite 2  Feeling bad or failure about yourself  2  Trouble concentrating 0  Moving slowly or fidgety/restless 1  Suicidal thoughts 0  PHQ-9 Score 12  Difficult doing work/chores Somewhat difficult  Some encounter information is confidential and restricted. Go to Review Flowsheets activity to see all data.   Subjective:   Other fatigue Elizabeth Foster admits to daytime somnolence and admits to waking up  still tired. Patent has a history of symptoms of daytime fatigue and morning fatigue. Elizabeth Foster generally gets 6 to 8 hours of sleep per night, and states that she has generally restful sleep. Snoring is not present. Apneic episodes are not present. Epworth Sleepiness Score is 4.  Shortness of breath on exertion Elizabeth Foster notes increasing shortness of breath with exercising and seems to be worsening over time with weight gain. She notes getting out of breath sooner with activity than she used to. This has not gotten worse recently. Elizabeth Foster denies shortness of breath at rest or orthopnea.  At risk for heart disease Elizabeth Foster is at a higher than average risk for cardiovascular disease due to obesity. Reviewed: no chest pain on exertion, no dyspnea on exertion, and no swelling of ankles.  Other hyperlipidemia  Elizabeth Foster has a history of elevated LDL. She want to work on diet and exercise. She wants to improve her cholesterol levels with intensive lifestyle modification including a low saturated fat diet, exercise and weight loss. She denies any chest pain or myalgias.  Lab Results  Component Value Date   ALT 12 11/13/2018   AST 16 11/13/2018   ALKPHOS 72 11/13/2018   BILITOT 0.4 11/13/2018   Lab Results  Component Value Date   CHOL 192 10/15/2017   HDL 54 10/15/2017   LDLCALC 120 (H) 10/15/2017   TRIG 92 10/15/2017   CHOLHDL 3.6 10/15/2017   Anxiety Elizabeth Foster is on Zoloft. She notes some emotional eating and increased stress this last year. She is attempting to work on behavior modification techniques to help reduce her emotional eating. She shows no sign of  suicidal or homicidal ideations.  Assessment/Plan:   Other fatigue Elizabeth Foster does feel that her weight is causing her energy to be lower than it should be. Fatigue may be related to obesity, depression or many other causes. Labs and EKG will be ordered, and in the meanwhile, Elizabeth Foster will focus on self care including making healthy food choices,  increasing physical activity and focusing on stress reduction.  Shortness of breath on exertion Elizabeth Foster does feel that she gets out of breath more easily that she used to when she exercises. Elizabeth Foster's shortness of breath appears to be obesity related and exercise induced. She has agreed to work on weight loss and gradually increase exercise to treat her exercise induced shortness of breath. Labs and indirect calorimetry will be ordered today. Will continue to monitor closely.  At risk for heart disease Elizabeth Foster was given approximately 30 minutes of coronary artery disease prevention counseling today. She is 34 y.o. female and has risk factors for heart disease including obesity. We discussed intensive lifestyle modifications today with an emphasis on specific weight loss instructions and strategies.   Repetitive spaced learning was employed today to elicit superior memory formation and behavioral change.  Other hyperlipidemia  Cardiovascular risk and specific lipid/LDL goals reviewed. We discussed several lifestyle modifications today and Elizabeth Foster will continue to work on diet, exercise and weight loss efforts. We will check labs and follow. Orders and follow up as documented in patient record.   Counseling Intensive lifestyle modifications are the first line treatment for this issue. . Dietary changes: Increase soluble fiber. Decrease simple carbohydrates. . Exercise changes: Moderate to vigorous-intensity aerobic activity 150 minutes per week if tolerated. . Lipid-lowering medications: see documented in medical record.  Anxiety Behavior modification techniques were discussed today to help Elizabeth Foster deal with her emotional/non-hunger eating behaviors. We will refer patient to Dr. Mallie Mussel, our bariatric psychologist. Orders and follow up as documented in patient record.   Class 3 severe obesity with serious comorbidity and body mass index (BMI) of 45.0 to 49.9 in adult, unspecified obesity type  (HCC) Elizabeth Foster is currently in the action stage of change and her goal is to continue with weight loss efforts. I recommend Elizabeth Foster begin the structured treatment plan as follows:  She has agreed to the Category 3 Plan.   Behavioral modification strategies: increasing lean protein intake and dealing with family or coworker sabotage.  She was informed of the importance of frequent follow-up visits to maximize her success with intensive lifestyle modifications for her multiple health conditions. She was informed we would discuss her lab results at her next visit unless there is a critical issue that needs to be addressed sooner. Elizabeth Foster agreed to keep her next visit at the agreed upon time to discuss these results.  Objective:   Blood pressure 114/77, pulse 70, temperature 98.3 F (36.8 C), temperature source Oral, height 5\' 4"  (1.626 m), weight 267 lb (121.1 kg), last menstrual period 06/28/2019, SpO2 100 %, unknown if currently breastfeeding. Body mass index is 45.83 kg/m.  EKG: Normal sinus rhythm, rate 69 BPM.  Indirect Calorimeter completed today shows a VO2 of 298 and a REE of 2072.  Her calculated basal metabolic rate is [id number] thus her basal metabolic rate is better than expected.  General: Cooperative, alert, well developed, in no acute distress. HEENT: Conjunctivae and lids unremarkable. Cardiovascular: Regular rhythm.  Lungs: Normal work of breathing. Neurologic: No focal deficits.   Lab Results  Component Value Date   CREATININE 0.74 11/13/2018   BUN  9 11/13/2018   NA 138 11/13/2018   K 4.3 11/13/2018   CL 102 11/13/2018   CO2 20 11/13/2018   Lab Results  Component Value Date   ALT 12 11/13/2018   AST 16 11/13/2018   ALKPHOS 72 11/13/2018   BILITOT 0.4 11/13/2018   No results found for: HGBA1C No results found for: INSULIN Lab Results  Component Value Date   TSH 1.230 10/15/2017   Lab Results  Component Value Date   CHOL 192 10/15/2017   HDL 54 10/15/2017    LDLCALC 120 (H) 10/15/2017   TRIG 92 10/15/2017   CHOLHDL 3.6 10/15/2017   Lab Results  Component Value Date   WBC 10.4 11/13/2018   HGB 14.2 11/13/2018   HCT 43.8 11/13/2018   MCV 87 11/13/2018   PLT 301 11/13/2018   Lab Results  Component Value Date   FERRITIN 69 12/30/2018    Attestation Statements:   Reviewed by clinician on day of visit: allergies, medications, problem list, medical history, surgical history, family history, social history, and previous encounter notes.  I, Doreene Nest, am acting as transcriptionist for Dennard Nip, MD.  I have reviewed the above documentation for accuracy and completeness, and I agree with the above. - Dennard Nip, MD

## 2019-07-01 NOTE — Progress Notes (Signed)
Office: [phone]  /  Fax: [phone]    Date: July 08, 2019   Appointment Start Time: 11:00am Duration: 31 minutes Provider: Glennie Isle, Psy.D. Type of Session: Intake for Individual Therapy  Location of Patient: Home Location of Provider: Provider's Home Type of Contact: Telepsychological Visit via [employer]  Informed Consent: Prior to proceeding with today's appointment, two pieces of identifying information were obtained. In addition, Elizabeth Foster's physical location at the time of this appointment was obtained as well a phone number she could be reached at in the event of technical difficulties. [place]ell and this provider participated in today's telepsychological service. Of note, she shared her two year old son was present in the room with her as the babysitter had to cancel. She explained he was busy coloring; therefore, she was comfortable proceeding from a confidentiality stand point. This provider periodically checked-in with Elizabeth Foster to ensure her son was unable to hear today's appointment.   The provider's role was explained to Elizabeth Foster. The provider reviewed and discussed issues of confidentiality, privacy, and limits therein (e.g., reporting obligations). In addition to verbal informed consent, written informed consent for psychological services was obtained prior to the initial appointment. Since the clinic is not a 24/7 crisis center, mental health emergency resources were shared and this  provider explained MyChart, e-mail, voicemail, and/or other messaging systems should be utilized only for non-emergency reasons. This provider also explained that information obtained during appointments will be placed in Elizabeth Foster's medical record and relevant information will be shared with other providers at [facility] for coordination of care. Moreover, Elizabeth Foster agreed information may be shared with other [facility] providers as needed for coordination of  care. By signing the service agreement document, Elizabeth Foster provided written consent for coordination of care. Prior to initiating telepsychological services, Elizabeth Foster completed an informed consent document, which included the development of a safety plan (i.e., an emergency contact, nearest emergency room, and emergency resources) in the event of an emergency/crisis. Elizabeth Foster expressed understanding of the rationale of the safety plan. Elizabeth Foster verbally acknowledged understanding she is ultimately responsible for understanding her insurance benefits for telepsychological and in-person services. This provider also reviewed confidentiality, as it relates to telepsychological services, as well as the rationale for telepsychological services (i.e., to reduce exposure risk to COVID-19). Elizabeth Foster  acknowledged understanding that appointments cannot be recorded without both party consent and she is aware she is responsible for securing confidentiality on her end of the session. Elizabeth Foster verbally consented to proceed.  Chief Complaint/HPI: Elizabeth Foster was referred by Dr. Dennard Nip due to anxiety. Per the note for the initial visit with Dr. Dennard Nip on July 01, 2019, "Elizabeth Foster is on Zoloft. She notes some emotional eating and increased stress this last year. She is attempting to work on behavior modification techniques to help reduce her emotional eating. She shows no sign of suicidal or homicidal ideations." The note for the initial appointment with Dr. 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  During today's appointment, Elizabeth Foster was verbally administered a questionnaire assessing various behaviors related to emotional eating. Elizabeth Foster endorsed the following: eat certain foods when you are anxious, stressed, depressed, or your feelings are hurt and overeat when you are angry or upset. She shared she craves salty foods and some sweets. Elizabeth Foster believes the onset of emotional eating was likely early adulthood, but noted an exacerbation in the past three years ago when she started taking care of her parental grandparents. She described the current frequency of emotional eating as "one to two times per week." In addition, Elizabeth Foster denied a history of binge eating. Elizabeth Foster denied a history of restricting food intake, purging and engagement in other compensatory strategies, and has never been diagnosed with an eating disorder. She also denied a history of treatment for emotional eating. Moreover, Elizabeth Foster indicated stress triggers emotional eating, whereas walking, reading, and engaging in other activities makes emotional eating better. 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   Mental Status Examination:  Appearance: well groomed and appropriate hygiene  Behavior: appropriate to circumstances Mood: euthymic Affect: mood congruent Speech: normal in rate, volume, and tone Eye Contact: appropriate Psychomotor Activity: appropriate Gait: unable to assess Thought Process: linear, logical, and goal directed  Thought Content/Perception: denies suicidal and homicidal ideation, plan, and intent and no hallucinations, delusions, bizarre thinking or behavior reported or observed Orientation: time, person, place  and purpose of appointment Memory/Concentration: memory, attention, language, and fund of knowledge intact  Insight/Judgment: good  Family & Psychosocial History: Glory reported she is married and she has three children (ages 83, 59, and 50). She indicated she is currently employed with [employer] as a Marine scientist. Additionally, Jami shared her highest level of education obtained is an associate's degree. Currently, Minnetta's social support system consists of her husband, mother, paternal grandparents, friends, co-workers, and maternal aunts and uncles. Moreover, Rudie stated she resides with her husband and children.   Medical History:  Past Medical History:  Diagnosis Date  . Allergy   . Anxiety   . Back pain   . Chronic kidney disease    chronic uti and kidney infections  . GERD (gastroesophageal reflux disease)   . Hypertension during pregnancy   . Knee pain   . Lower extremity edema   . Obesity   . RLS (restless legs syndrome)   . Varicose vein of leg    Past Surgical History:  Procedure Laterality Date  . ADENOIDECTOMY    . CESAREAN SECTION WITH BILATERAL TUBAL LIGATION N/A 04/05/2017   Procedure: CESAREAN SECTION WITH BILATERAL TUBAL LIGATION;  Surgeon: Marylynn Pearson, MD;  Location: [place];  Service: Obstetrics;  Laterality: N/A;  . KNEE ARTHROSCOPY     x2  . MIDDLE EAR SURGERY     benign growth removed from eardrum  . TONSILLECTOMY     Current Outpatient Medications on File Prior to Visit  Medication Sig Dispense Refill  . acetaminophen (TYLENOL) 325 MG tablet Take 650 mg every 6 (six) hours as needed by mouth for moderate pain.    . Biotin 10000 MCG TABS Take 1 tablet by mouth every 3 (three) days.    . Cranberry 1000 MG CAPS Take by mouth.    . fexofenadine (ALLEGRA ODT) 30 MG disintegrating tablet Take 30 mg by mouth daily.    . fluticasone (  FLONASE) 50 MCG/ACT nasal spray Place into both nostrils daily.    Marland Kitchen ibuprofen (ADVIL) 200 MG tablet Take 200 mg  by mouth every 6 (six) hours as needed.    . Multiple Vitamin (MULTIVITAMIN) capsule Take 1 capsule by mouth daily.    . Omega-3 Fatty Acids (FISH OIL) 1000 MG CAPS Take by mouth.    . pramipexole (MIRAPEX) 0.5 MG tablet Take one po qhs 90 tablet 1  . sertraline (ZOLOFT) 50 MG tablet Take 1 1/2 tabs po qd 135 tablet 0   No current facility-administered medications on file prior to visit.  Blu denied a history of head injuries and loss of consciousness.    Mental Health History: Oyindamola reported there is no history of therapeutic services. Gussie reported there is no history of hospitalizations for psychiatric concerns, and has never met with a psychiatrist. Lille stated her PCP prescribes Zoloft, noting it is helpful. Eleesha endorsed a family history of mental health related concerns. She reported her paternal grandmother suffers from anxiety and depression. Cena reported there is no history of trauma including psychological, physical  and sexual abuse, as well as neglect.   Mckenlee described her typical mood lately as "pretty good." Aside from concerns noted above and endorsed on the PHQ-9, Aaleeyah reported experiencing worry about the well-being of her family. Loriana denied current alcohol use. She denied tobacco use. She denied illicit/recreational substance use. Regarding caffeine intake, Arasely reported consuming two cans of soda daily and a cup of coffee 3-4 times a day. She noted she is trying to switch to diet soda. Furthermore, Elizabeth Foster indicated she is not experiencing the following: hopelessness, hallucinations and delusions, paranoia, symptoms of mania (e.g., expansive mood, flighty ideas, decreased need for sleep, engagement in risky behaviors), angry outbursts, social withdrawal, crying spells, panic attacks and decreased motivation. She also denied history of and current suicidal ideation, plan, and intent; history of and current homicidal ideation, plan, and intent; and history  of and current engagement in self-harm.  The following strengths were reported by Curtisha: good organizer, positive, good nurse, and family oriented. The following strengths were observed by this provider: ability to express thoughts and feelings during the therapeutic session, ability to establish and benefit from a therapeutic relationship, willingness to work toward established goal(s) with the clinic and ability to engage in reciprocal conversation.  Legal History: Letisha reported there is no history of legal involvement.   Structured Assessments Results: The Patient Health Questionnaire-9 (PHQ-9) is a self-report measure that assesses symptoms and severity of depression over the course of the last two weeks. Neela obtained a score of 2 suggesting minimal depression. Francies finds the endorsed symptoms to be not difficult at all. [0= Not at all; 1= Several days; 2= More than half the days; 3= Nearly every day] Little interest or pleasure in doing things 0  Feeling down, depressed, or hopeless 0  Trouble falling or staying asleep, or sleeping too much 0  Feeling tired or having little energy 1  Poor appetite or overeating 1  Feeling bad about yourself --- or that you are a failure or have let yourself or your family down 0  Trouble concentrating on things, such as reading the newspaper or watching television 0  Moving or speaking so slowly that other people could have noticed? Or the opposite --- being so fidgety or restless that you have been moving around a lot more than usual 0  Thoughts that you would be better off dead or hurting yourself  in some way 0  PHQ-9 Score 2    The Generalized Anxiety Disorder-7 (GAD-7) is a brief self-report measure that assesses symptoms of anxiety over the course of the last two weeks. Jahleah obtained a score of 0. [0= Not at all; 1= Several days; 2= Over half the days; 3= Nearly every day] Feeling nervous, anxious, on edge 0  Not being able to stop or  control worrying 0  Worrying too much about different things 0  Trouble relaxing 0  Being so restless that it's hard to sit still 0  Becoming easily annoyed or irritable 0  Feeling afraid as if something awful might happen 0  GAD-7 Score 0   Interventions:  Conducted a chart review Focused on rapport building Verbally administered PHQ-9 and GAD-7 for symptom monitoring Verbally administered Food & Mood questionnaire to assess various behaviors related to emotional eating. Provided emphatic reflections and validation Collaborated with patient on a treatment goal  Psychoeducation provided regarding physical versus emotional hunger  Provisional DSM-5 Diagnosis: 300.09 (F41.8) Other Specified Anxiety Disorder, Emotional Eating Behaviors  Plan: Lakin appears able and willing to participate as evidenced by collaboration on a treatment goal, engagement in reciprocal conversation, and asking questions as needed for clarification. The next appointment will be scheduled in two weeks, which will be via [employer]. The following treatment goal was established: increase coping skills. This provider will regularly review the treatment plan and medical chart to keep informed of status changes. Loriene expressed understanding and agreement with the initial treatment plan of care. Glena will be sent a handout via e-mail to utilize between now and the next appointment to increase awareness of hunger patterns and subsequent eating. Maitland provided verbal consent during today's appointment for this provider to send the handout via e-mail.

## 2019-07-02 LAB — LIPID PANEL WITH LDL/HDL RATIO
Cholesterol, Total: 204 mg/dL — ABNORMAL HIGH (ref 100–199)
HDL: 49 mg/dL (ref >39)
LDL Chol Calc (NIH): 137 mg/dL — ABNORMAL HIGH (ref 0–99)
LDL/HDL Ratio: 2.8 ratio (ref 0.0–3.2)
Triglycerides: 99 mg/dL (ref 0–149)
VLDL Cholesterol Cal: 18 mg/dL (ref 5–40)

## 2019-07-02 LAB — COMPREHENSIVE METABOLIC PANEL
ALT: 11 IU/L (ref 0–32)
AST: 15 IU/L (ref 0–40)
Albumin/Globulin Ratio: 1.6 (ref 1.2–2.2)
Albumin: 4.4 g/dL (ref 3.8–4.8)
Alkaline Phosphatase: 77 IU/L (ref 39–117)
BUN/Creatinine Ratio: 18 (ref 9–23)
BUN: 11 mg/dL (ref 6–20)
Bilirubin Total: 0.3 mg/dL (ref 0.0–1.2)
CO2: 21 mmol/L (ref 20–29)
Calcium: 9.5 mg/dL (ref 8.7–10.2)
Chloride: 104 mmol/L (ref 96–106)
Creatinine, Ser: 0.61 mg/dL (ref 0.57–1.00)
GFR calc Af Amer: 138 mL/min/{1.73_m2} (ref >59)
GFR calc non Af Amer: 119 mL/min/{1.73_m2} (ref >59)
Globulin, Total: 2.7 g/dL (ref 1.5–4.5)
Glucose: 88 mg/dL (ref 65–99)
Potassium: 4.1 mmol/L (ref 3.5–5.2)
Sodium: 140 mmol/L (ref 134–144)
Total Protein: 7.1 g/dL (ref 6.0–8.5)

## 2019-07-02 LAB — CBC WITH DIFFERENTIAL/PLATELET
Basophils Absolute: 0.1 10*3/uL (ref 0.0–0.2)
Basos: 1 %
EOS (ABSOLUTE): 0.3 10*3/uL (ref 0.0–0.4)
Eos: 4 %
Hematocrit: 39 % (ref 34.0–46.6)
Hemoglobin: 13.1 g/dL (ref 11.1–15.9)
Immature Grans (Abs): 0 10*3/uL (ref 0.0–0.1)
Immature Granulocytes: 0 %
Lymphocytes Absolute: 2.4 10*3/uL (ref 0.7–3.1)
Lymphs: 30 %
MCH: 28.6 pg (ref 26.6–33.0)
MCHC: 33.6 g/dL (ref 31.5–35.7)
MCV: 85 fL (ref 79–97)
Monocytes Absolute: 0.4 10*3/uL (ref 0.1–0.9)
Monocytes: 5 %
Neutrophils Absolute: 4.9 10*3/uL (ref 1.4–7.0)
Neutrophils: 60 %
Platelets: 331 10*3/uL (ref 150–450)
RBC: 4.58 x10E6/uL (ref 3.77–5.28)
RDW: 12.7 % (ref 11.7–15.4)
WBC: 8.1 10*3/uL (ref 3.4–10.8)

## 2019-07-02 LAB — T3: T3, Total: 107 ng/dL (ref 71–180)

## 2019-07-02 LAB — INSULIN, RANDOM: INSULIN: 6.2 u[IU]/mL (ref 2.6–24.9)

## 2019-07-02 LAB — HEMOGLOBIN A1C
Est. average glucose Bld gHb Est-mCnc: 105 mg/dL
Hgb A1c MFr Bld: 5.3 % (ref 4.8–5.6)

## 2019-07-02 LAB — TSH: TSH: 1.3 u[IU]/mL (ref 0.450–4.500)

## 2019-07-02 LAB — VITAMIN D 25 HYDROXY (VIT D DEFICIENCY, FRACTURES): Vit D, 25-Hydroxy: 28.1 ng/mL — ABNORMAL LOW (ref 30.0–100.0)

## 2019-07-02 LAB — VITAMIN B12: Vitamin B-12: 513 pg/mL (ref 232–1245)

## 2019-07-02 LAB — T4, FREE: Free T4: 1.23 ng/dL (ref 0.82–1.77)

## 2019-07-02 LAB — FOLATE: Folate: >20 ng/mL (ref >3.0)

## 2019-07-08 ENCOUNTER — Other Ambulatory Visit: Payer: Self-pay

## 2019-07-08 ENCOUNTER — Ambulatory Visit (INDEPENDENT_AMBULATORY_CARE_PROVIDER_SITE_OTHER): Payer: No Typology Code available for payment source | Admitting: Psychology

## 2019-07-08 DIAGNOSIS — F418 Other specified anxiety disorders: Secondary | ICD-10-CM

## 2019-07-12 NOTE — Progress Notes (Unsigned)
Office: [phone]  /  Fax: [phone]    Date: July 26, 2019   Appointment Start Time: *** Duration: *** minutes Provider: Glennie Isle, Psy.D. Type of Session: Individual Therapy  Location of Patient: {gbptloc:23249} Location of Provider: {Location of Service:22491} Type of Contact: Telepsychological Visit via {gbtelepsych:23399}  Session Content: Elizabeth Foster is a 34 y.o. female presenting via {gbtelepsych:23399} for a follow-up appointment to address the previously established treatment goal of decreasing emotional eating. Today's appointment was a telepsychological visit due to COVID-19. [name] provided verbal consent for today's telepsychological appointment and she is aware she is responsible for securing confidentiality on her end of the session. Prior to proceeding with today's appointment, [name]'s physical location at the time of this appointment was obtained as well a phone number she could be reached at in the event of technical difficulties. Vasiliki and this provider participated in today's telepsychological service.   This provider conducted a brief check-in and verbally administered the PHQ-9 and GAD-7. *** [name] was receptive to today's appointment as evidenced by openness to sharing, responsiveness to feedback, and {gbreceptiveness:23401}.  Mental Status Examination:  Appearance: {Appearance:22431} Behavior: {Behavior:22445} Mood: {gbmood:21757} Affect: {Affect:22436} Speech: {Speech:22432} Eye Contact: {Eye Contact:22433} Psychomotor Activity: {Motor Activity:22434} Gait: {gbgait:23404} Thought Process: {thought process:22448}  Thought Content/Perception: {disturbances:22451} Orientation: {Orientation:22437} Memory/Concentration: {gbcognition:22449} Insight/Judgment: {Insight:22446}  Structured Assessments Results: The Patient Health Questionnaire-9 (PHQ-9) is a self-report measure that assesses symptoms and severity of depression over the course of the  last two weeks. [name] obtained a score of *** suggesting {GBPHQ9SEVERITY:21752}. [name] finds the endorsed symptoms to be {gbphq9difficulty:21754}. [0= Not at all; 1= Several days; 2= More than half the days; 3= Nearly every day] Little interest or pleasure in doing things ***  Feeling down, depressed, or hopeless ***  Trouble falling or staying asleep, or sleeping too much ***  Feeling tired or having little energy ***  Poor appetite or overeating ***  Feeling bad about yourself --- or that you are a failure or have let yourself or your family down ***  Trouble concentrating on things, such as reading the newspaper or watching television ***  Moving or speaking so slowly that other people could have noticed? Or the opposite --- being so fidgety or restless that you have been moving around a lot more than usual ***  Thoughts that you would be better off dead or hurting yourself in some way ***  PHQ-9 Score ***    The Generalized Anxiety Disorder-7 (GAD-7) is a brief self-report measure that assesses symptoms of anxiety over the course of the last two weeks. [name] obtained a score of *** suggesting {gbgad7severity:21753}. [name] finds the endorsed symptoms to be {gbphq9difficulty:21754}. 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 [name]  has demonstrated progress in her goal as evidenced by {gbtxprogress:22839}. [name] also {gbtxprogress2:22951}.  Plan: The next appointment will be scheduled in {gbweeks:21758}, which will  be {gbtxmodality:23402}. The next session will focus on {Plan for Next Appointment:23400}.

## 2019-07-15 ENCOUNTER — Ambulatory Visit (INDEPENDENT_AMBULATORY_CARE_PROVIDER_SITE_OTHER): Payer: No Typology Code available for payment source | Admitting: Family Medicine

## 2019-07-16 MED FILL — PRAMIPEXOLE 0.5 MG TABLET: 0.5 | 90 days supply | Qty: 90 | Fill #1

## 2019-07-19 ENCOUNTER — Other Ambulatory Visit: Payer: Self-pay

## 2019-07-19 ENCOUNTER — Ambulatory Visit (INDEPENDENT_AMBULATORY_CARE_PROVIDER_SITE_OTHER): Payer: No Typology Code available for payment source | Admitting: Family Medicine

## 2019-07-19 ENCOUNTER — Encounter (INDEPENDENT_AMBULATORY_CARE_PROVIDER_SITE_OTHER): Payer: Self-pay | Admitting: Family Medicine

## 2019-07-19 VITALS — BP 119/70 | HR 73 | Temp 98.3°F | Ht 64.0 in | Wt 266.0 lb

## 2019-07-19 DIAGNOSIS — E7849 Other hyperlipidemia: Secondary | ICD-10-CM | POA: Diagnosis not present

## 2019-07-19 DIAGNOSIS — F3289 Other specified depressive episodes: Secondary | ICD-10-CM | POA: Diagnosis not present

## 2019-07-19 DIAGNOSIS — G2581 Restless legs syndrome: Secondary | ICD-10-CM | POA: Diagnosis not present

## 2019-07-19 DIAGNOSIS — E559 Vitamin D deficiency, unspecified: Secondary | ICD-10-CM

## 2019-07-19 DIAGNOSIS — Z6841 Body Mass Index (BMI) 40.0 and over, adult: Secondary | ICD-10-CM

## 2019-07-19 MED ORDER — PHENTERMINE HCL 37.5 MG PO TABS: 37.5000 mg | ORAL_TABLET | Freq: Every day | ORAL | 0 refills | Status: DC

## 2019-07-19 MED FILL — PHENTERMINE 37.5 MG TABLET: 37.5 | 30 days supply | Qty: 30 | Fill #0

## 2019-07-20 MED ORDER — VITAMIN D (ERGOCALCIFEROL) 1.25 MG (50000 UNIT) PO CAPS: 50000.0000 [IU] | ORAL_CAPSULE | ORAL | 0 refills | Status: DC

## 2019-07-20 MED FILL — VIT D2 1.25 MG (50,000 UNIT: 1.25 MG | 28 days supply | Qty: 4 | Fill #0

## 2019-07-20 NOTE — Progress Notes (Addendum)
Chief Complaint:   OBESITY Aurilla is here to discuss her progress with her obesity treatment plan along with follow-up of her obesity related diagnoses. Tawana is on the Category 3 Plan and states she is following her eating plan approximately 50% of the time. Danicia states she is getting 7000-10,000 steps 4 times per week.  Today's visit was #: 2 Starting weight: 267 lbs Starting date: 07/01/2019 Today's weight: 266 lbs Today's date: 07/19/2019 Total lbs lost to date: 1 lb Total lbs lost since last in-office visit: 1 lb  Interim History: Edyth has three children, ages 100, 50, and 2.  She homeschools.  Her husband is helpful.  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  She gets 7-8 hours of interrupted sleep.  She has RLS and takes Mirapex.  She has to increase her caffeine and sugar to get through the day.  Subjective:   1. Vitamin D deficiency Yazmina's Vitamin D level was 28.1 on 07/01/2019. She is not currently taking vit D. She denies nausea, vomiting or muscle weakness.  2. Other hyperlipidemia Nea has hyperlipidemia and has been trying to improve her cholesterol levels with intensive lifestyle modification including a low saturated fat diet, exercise and weight loss. She denies any chest pain, claudication or myalgias.  Lab Results  Component Value Date   ALT 11 07/01/2019   AST 15 07/01/2019   ALKPHOS 77 07/01/2019   BILITOT 0.3 07/01/2019   Lab Results  Component Value Date   CHOL 204 (H) 07/01/2019   HDL 49 07/01/2019   LDLCALC 137 (H) 07/01/2019   TRIG 99 07/01/2019   CHOLHDL 3.6 10/15/2017   3. Restless leg syndrome Yuma is taking Mirapex 0.5 mg at bedtime.  4. Other depression, with emotional eating Rhondia is struggling with emotional eating and using food for comfort to the extent that it is negatively impacting her health. She has been working on behavior modification techniques to help reduce her emotional eating and has been  unsuccessful. She shows no sign of suicidal or homicidal ideations.  She is taking Zoloft and has seen Dr. Mallie Mussel.  Assessment/Plan:   1. Vitamin D deficiency Low Vitamin D level contributes to fatigue and are associated with obesity, breast, and colon cancer. She agrees to continue to take prescription Vitamin D @50 ,000 IU every week and will follow-up for routine testing of Vitamin D, at least 2-3 times per year to avoid over-replacement.  Orders - Vitamin D, Ergocalciferol, (DRISDOL) 1.25 MG (50000 UNIT) CAPS capsule; Take 1 capsule (50,000 Units total) by mouth every 7 (seven) days.  Dispense: 4 capsule; Refill: 0  2. Other hyperlipidemia Cardiovascular risk and specific lipid/LDL goals reviewed.  We discussed several lifestyle modifications today and Malyna will continue to work on diet, exercise and weight loss efforts. Orders and follow up as documented in patient record.   Counseling Intensive lifestyle modifications are the first line treatment for this issue. . Dietary changes: Increase soluble fiber. Decrease simple carbohydrates. . Exercise changes: Moderate to vigorous-intensity aerobic activity 150 minutes per week if tolerated. . Lipid-lowering medications: see documented in medical record.  3. Restless leg syndrome We will continue to monitor. Orders and follow up as documented in patient record.  4. Other depression, with emotional eating Behavior modification techniques were discussed today to help Billiejean deal with her emotional/non-hunger eating behaviors.  Orders and follow up as documented in patient record.   5. Class 3 severe obesity with serious comorbidity and body  mass index (BMI) of 45.0 to 49.9 in adult, unspecified obesity type (HCC) Nithila is currently in the action stage of change. As such, her goal is to continue with weight loss efforts. She has agreed to the Category 3 Plan.   Exercise goals: As is.  Behavioral modification strategies: increasing  lean protein intake, increasing water intake and decreasing liquid calories.  Start phentermine 37.5 mg once daily.  Jerlean has agreed to follow-up with our clinic in 2 weeks. She was informed of the importance of frequent follow-up visits to maximize her success with intensive lifestyle modifications for her multiple health conditions.   Objective:   Blood pressure 119/70, pulse 73, temperature 98.3 F (36.8 C), temperature source Oral, height 5\' 4"  (1.626 m), weight 266 lb (120.7 kg), last menstrual period 06/28/2019, SpO2 98 %, unknown if currently breastfeeding. Body mass index is 45.66 kg/m.  General: Cooperative, alert, well developed, in no acute distress. HEENT: Conjunctivae and lids unremarkable. Cardiovascular: Regular rhythm.  Lungs: Normal work of breathing. Neurologic: No focal deficits.   Lab Results  Component Value Date   CREATININE 0.61 07/01/2019   BUN 11 07/01/2019   NA 140 07/01/2019   K 4.1 07/01/2019   CL 104 07/01/2019   CO2 21 07/01/2019   Lab Results  Component Value Date   ALT 11 07/01/2019   AST 15 07/01/2019   ALKPHOS 77 07/01/2019   BILITOT 0.3 07/01/2019   Lab Results  Component Value Date   HGBA1C 5.3 07/01/2019   Lab Results  Component Value Date   INSULIN 6.2 07/01/2019   Lab Results  Component Value Date   TSH 1.300 07/01/2019   Lab Results  Component Value Date   CHOL 204 (H) 07/01/2019   HDL 49 07/01/2019   LDLCALC 137 (H) 07/01/2019   TRIG 99 07/01/2019   CHOLHDL 3.6 10/15/2017   Lab Results  Component Value Date   WBC 8.1 07/01/2019   HGB 13.1 07/01/2019   HCT 39.0 07/01/2019   MCV 85 07/01/2019   PLT 331 07/01/2019   Lab Results  Component Value Date   FERRITIN 69 12/30/2018   Attestation Statements:   Reviewed by clinician on day of visit: allergies, medications, problem list, medical history, surgical history, family history, social history, and previous encounter notes.  During the visit, I independently  reviewed the patient's EKG, bioimpedance scale results, and indirect calorimeter results. I used this information to tailor a meal plan for the patient that will help her to lose weight and will improve her obesity-related conditions going forward. I performed a medically necessary appropriate examination and/or evaluation. I discussed the assessment and treatment plan with the patient. The patient was provided an opportunity to ask questions and all were answered. The patient agreed with the plan and demonstrated an understanding of the instructions. Clinical information was updated and documented in the EMR. Time spent on visit including pre-visit chart review and post-visit care was 45 minutes.  I, Water quality scientist, CMA, am acting as Location manager for [employer], DO.  I have reviewed the above documentation for accuracy and completeness, and I agree with the above. Briscoe Deutscher, DO

## 2019-07-26 ENCOUNTER — Ambulatory Visit (INDEPENDENT_AMBULATORY_CARE_PROVIDER_SITE_OTHER): Payer: Self-pay | Admitting: Psychology

## 2019-08-02 ENCOUNTER — Encounter (INDEPENDENT_AMBULATORY_CARE_PROVIDER_SITE_OTHER): Payer: Self-pay | Admitting: Family Medicine

## 2019-08-02 ENCOUNTER — Other Ambulatory Visit: Payer: Self-pay

## 2019-08-02 ENCOUNTER — Telehealth (INDEPENDENT_AMBULATORY_CARE_PROVIDER_SITE_OTHER): Payer: No Typology Code available for payment source | Admitting: Family Medicine

## 2019-08-02 DIAGNOSIS — E559 Vitamin D deficiency, unspecified: Secondary | ICD-10-CM

## 2019-08-02 DIAGNOSIS — R632 Polyphagia: Secondary | ICD-10-CM | POA: Diagnosis not present

## 2019-08-02 DIAGNOSIS — E7849 Other hyperlipidemia: Secondary | ICD-10-CM

## 2019-08-02 DIAGNOSIS — F3289 Other specified depressive episodes: Secondary | ICD-10-CM

## 2019-08-02 DIAGNOSIS — G2581 Restless legs syndrome: Secondary | ICD-10-CM | POA: Diagnosis not present

## 2019-08-02 DIAGNOSIS — Z6841 Body Mass Index (BMI) 40.0 and over, adult: Secondary | ICD-10-CM

## 2019-08-02 MED ORDER — VITAMIN D (ERGOCALCIFEROL) 1.25 MG (50000 UNIT) PO CAPS: 50000.0000 [IU] | ORAL_CAPSULE | ORAL | 0 refills | Status: DC

## 2019-08-03 NOTE — Progress Notes (Signed)
TeleHealth Visit:  Due to the COVID-19 pandemic, this visit was completed with telemedicine (audio/video) technology to reduce patient and provider exposure as well as to preserve personal protective equipment.   Elizabeth Foster has verbally consented to this TeleHealth visit. The patient is located at home, the provider is located at the [facility] office. The participants in this visit include the listed provider and patient. The visit was conducted today via [email or web address].  Chief Complaint: OBESITY Elizabeth Foster is here to discuss her progress with her obesity treatment plan along with follow-up of her obesity related diagnoses. Elizabeth Foster is on the Category 3 Plan and states she is following her eating plan approximately 50-60% of the time. Elizabeth Foster states she is walking for 30-45 minutes 3-4 times per week.  Today's visit was #: 3 Starting weight: 267 lbs Starting date: 07/01/2019  Interim History: Elizabeth Foster says she struggles with regular sodas.  She is trying to wean herself from them.  She ate some bad shrimp and had gastroenteritis over the weekend.   Subjective:   1. Vitamin D deficiency Elizabeth Foster's Vitamin D level was 28.1 on 07/01/2019. She is currently taking vit D. She denies nausea, vomiting or muscle weakness.  2. Other hyperlipidemia Elizabeth Foster has hyperlipidemia and has been trying to improve her cholesterol levels with intensive lifestyle modification including a low saturated fat diet, exercise and weight loss. She denies any chest pain, claudication or myalgias.  Lab Results  Component Value Date   ALT 11 07/01/2019   AST 15 07/01/2019   ALKPHOS 77 07/01/2019   BILITOT 0.3 07/01/2019   Lab Results  Component Value Date   CHOL 204 (H) 07/01/2019   HDL 49 07/01/2019   LDLCALC 137 (H) 07/01/2019   TRIG 99 07/01/2019   CHOLHDL 3.6 10/15/2017   3. Restless leg syndrome Elizabeth Foster has restless legs syndrome and takes pramipexole 0.5 mg at bedtime.  4. Polyphagia Elizabeth Foster  endorses excessive hunger.  She has not started taking phentermine yet.  5. Other depression, with emotional eating Elizabeth Foster is struggling with emotional eating and using food for comfort to the extent that it is negatively impacting her health. She has been working on behavior modification techniques to help reduce her emotional eating and has been unsuccessful. She shows no sign of suicidal or homicidal ideations.  She is taking Zoloft.  Assessment/Plan:   1. Vitamin D deficiency Low Vitamin D level contributes to fatigue and are associated with obesity, breast, and colon cancer. She agrees to continue to take prescription Vitamin D @50 ,000 IU every week and will follow-up for routine testing of Vitamin D, at least 2-3 times per year to avoid over-replacement.  - Vitamin D, Ergocalciferol, (DRISDOL) 1.25 MG (50000 UNIT) CAPS capsule; Take 1 capsule (50,000 Units total) by mouth every 7 (seven) days.  Dispense: 4 capsule; Refill: 0  2. Other hyperlipidemia Cardiovascular risk and specific lipid/LDL goals reviewed.  We discussed several lifestyle modifications today and Elizabeth Foster will continue to work on diet, exercise and weight loss efforts. Orders and follow up as documented in patient record.   Counseling Intensive lifestyle modifications are the first line treatment for this issue. . Dietary changes: Increase soluble fiber. Decrease simple carbohydrates. . Exercise changes: Moderate to vigorous-intensity aerobic activity 150 minutes per week if tolerated. . Lipid-lowering medications: see documented in medical record.  3. Restless leg syndrome Elizabeth Foster will continue taking pramipexole as needed.  4. Polyphagia Intensive lifestyle modifications are the first line treatment for this issue. We discussed several  lifestyle modifications today and she will continue to work on diet, exercise and weight loss efforts. Orders and follow up as documented in patient record.  Counseling . Polyphagia  is excessive hunger. . Causes can include: low blood sugars, hypERthyroidism, PMS, lack of sleep, stress, insulin resistance, diabetes, certain medications, and diets that are deficient in protein and fiber.   5. Other depression, with emotional eating Behavior modification techniques were discussed today to help Elizabeth Foster deal with her emotional/non-hunger eating behaviors.  Orders and follow up as documented in patient record.   6. Class 3 severe obesity with serious comorbidity and body mass index (BMI) of 45.0 to 49.9 in adult, unspecified obesity type (HCC) Elizabeth Foster is currently in the action stage of change. As such, her goal is to continue with weight loss efforts. She has agreed to the Category 3 Plan.   Exercise goals: As is.  Behavioral modification strategies: increasing lean protein intake and decreasing liquid calories.  Elizabeth Foster has agreed to follow-up with our clinic in 2 weeks. She was informed of the importance of frequent follow-up visits to maximize her success with intensive lifestyle modifications for her multiple health conditions.  Objective:   VITALS: Per patient if applicable, see vitals. GENERAL: Alert and in no acute distress. CARDIOPULMONARY: No increased WOB. Speaking in clear sentences.  PSYCH: Pleasant and cooperative. Speech normal rate and rhythm. Affect is appropriate. Insight and judgement are appropriate. Attention is focused, linear, and appropriate.  NEURO: Oriented as arrived to appointment on time with no prompting.   Lab Results  Component Value Date   CREATININE 0.61 07/01/2019   BUN 11 07/01/2019   NA 140 07/01/2019   K 4.1 07/01/2019   CL 104 07/01/2019   CO2 21 07/01/2019   Lab Results  Component Value Date   ALT 11 07/01/2019   AST 15 07/01/2019   ALKPHOS 77 07/01/2019   BILITOT 0.3 07/01/2019   Lab Results  Component Value Date   HGBA1C 5.3 07/01/2019   Lab Results  Component Value Date   INSULIN 6.2 07/01/2019   Lab Results    Component Value Date   TSH 1.300 07/01/2019   Lab Results  Component Value Date   CHOL 204 (H) 07/01/2019   HDL 49 07/01/2019   LDLCALC 137 (H) 07/01/2019   TRIG 99 07/01/2019   CHOLHDL 3.6 10/15/2017   Lab Results  Component Value Date   WBC 8.1 07/01/2019   HGB 13.1 07/01/2019   HCT 39.0 07/01/2019   MCV 85 07/01/2019   PLT 331 07/01/2019   Lab Results  Component Value Date   FERRITIN 69 12/30/2018   Attestation Statements:   Reviewed by clinician on day of visit: allergies, medications, problem list, medical history, surgical history, family history, social history, and previous encounter notes.  I, Water quality scientist, CMA, am acting as Location manager for [employer], DO.  I have reviewed the above documentation for accuracy and completeness, and I agree with the above. Briscoe Deutscher, DO

## 2019-08-11 MED FILL — VIT D2 1.25 MG (50,000 UNIT: 1.25 MG | 28 days supply | Qty: 4 | Fill #0

## 2019-08-18 ENCOUNTER — Other Ambulatory Visit: Payer: Self-pay

## 2019-08-18 ENCOUNTER — Encounter (INDEPENDENT_AMBULATORY_CARE_PROVIDER_SITE_OTHER): Payer: Self-pay | Admitting: Family Medicine

## 2019-08-18 ENCOUNTER — Ambulatory Visit (INDEPENDENT_AMBULATORY_CARE_PROVIDER_SITE_OTHER): Payer: No Typology Code available for payment source | Admitting: Family Medicine

## 2019-08-18 VITALS — BP 122/81 | HR 85 | Temp 97.9°F | Ht 64.0 in | Wt 262.0 lb

## 2019-08-18 DIAGNOSIS — E7849 Other hyperlipidemia: Secondary | ICD-10-CM

## 2019-08-18 DIAGNOSIS — F3289 Other specified depressive episodes: Secondary | ICD-10-CM

## 2019-08-18 DIAGNOSIS — Z6841 Body Mass Index (BMI) 40.0 and over, adult: Secondary | ICD-10-CM

## 2019-08-18 DIAGNOSIS — E559 Vitamin D deficiency, unspecified: Secondary | ICD-10-CM

## 2019-08-18 DIAGNOSIS — R632 Polyphagia: Secondary | ICD-10-CM

## 2019-08-19 MED ORDER — PHENTERMINE HCL 37.5 MG PO TABS: 37.5000 mg | ORAL_TABLET | Freq: Every day | ORAL | 0 refills | Status: DC

## 2019-08-19 MED FILL — PHENTERMINE 37.5 MG TABLET: 37.5 | 30 days supply | Qty: 30 | Fill #0

## 2019-08-19 NOTE — Progress Notes (Signed)
Chief Complaint:   OBESITY Tariya is here to discuss her progress with her obesity treatment plan along with follow-up of her obesity related diagnoses. Konni is on the Category 3 Plan and states she is following her eating plan approximately 60% of the time. Laiba states she is walking for 30-40 minutes 4 times per week.  Today's visit was #: 4 Starting weight: 267 lbs Starting date: 07/01/2019 Today's weight: 262 lbs Today's date: 08/18/2019 Total lbs lost to date: 5 lbs Total lbs lost since last in-office visit: 4 lbs  Interim History: Leslee says she gave up soda!  She reports that she is struggling at dinner.  Subjective:   1. Polyphagia Larya endorses excessive hunger.   2. Vitamin D deficiency Kaitlen's Vitamin D level was 28.1 on 07/01/2019. She is currently taking vit D. She denies nausea, vomiting or muscle weakness.  3. Other hyperlipidemia Raime has hyperlipidemia and has been trying to improve her cholesterol levels with intensive lifestyle modification including a low saturated fat diet, exercise and weight loss. She denies any chest pain, claudication or myalgias.  Lab Results  Component Value Date   ALT 11 07/01/2019   AST 15 07/01/2019   ALKPHOS 77 07/01/2019   BILITOT 0.3 07/01/2019   Lab Results  Component Value Date   CHOL 204 (H) 07/01/2019   HDL 49 07/01/2019   LDLCALC 137 (H) 07/01/2019   TRIG 99 07/01/2019   CHOLHDL 3.6 10/15/2017   4. Other depression, with emotional eating Maitland is struggling with emotional eating and using food for comfort to the extent that it is negatively impacting her health. She has been working on behavior modification techniques to help reduce her emotional eating and has been unsuccessful. She shows no sign of suicidal or homicidal ideations.  She is taking Zoloft 75 mg daily.  Assessment/Plan:   1. Polyphagia Intensive lifestyle modifications are the first line treatment for this issue. We discussed  several lifestyle modifications today and she will continue to work on diet, exercise and weight loss efforts. Orders and follow up as documented in patient record.  Counseling . Polyphagia is excessive hunger. . Causes can include: low blood sugars, hypERthyroidism, PMS, lack of sleep, stress, insulin resistance, diabetes, certain medications, and diets that are deficient in protein and fiber.   Orders - phentermine (ADIPEX-P) 37.5 MG tablet; Take 1 tablet (37.5 mg total) by mouth daily before breakfast.  Dispense: 30 tablet; Refill: 0  2. Vitamin D deficiency Low Vitamin D level contributes to fatigue and are associated with obesity, breast, and colon cancer. She agrees to continue to take prescription Vitamin D @50 ,000 IU every week and will follow-up for routine testing of Vitamin D, at least 2-3 times per year to avoid over-replacement.  3. Other hyperlipidemia Cardiovascular risk and specific lipid/LDL goals reviewed.  We discussed several lifestyle modifications today and Tari will continue to work on diet, exercise and weight loss efforts. Orders and follow up as documented in patient record.   Counseling Intensive lifestyle modifications are the first line treatment for this issue. . Dietary changes: Increase soluble fiber. Decrease simple carbohydrates. . Exercise changes: Moderate to vigorous-intensity aerobic activity 150 minutes per week if tolerated. . Lipid-lowering medications: see documented in medical record.  4. Other depression, with emotional eating Behavior modification techniques were discussed today to help Tikvah deal with her emotional/non-hunger eating behaviors.  Orders and follow up as documented in patient record.   5. Class 3 severe obesity with serious comorbidity  and body mass index (BMI) of 45.0 to 49.9 in adult, unspecified obesity type (HCC) Matasha is currently in the action stage of change. As such, her goal is to continue with weight loss efforts.  She has agreed to the Category 3 Plan.   Exercise goals: For substantial health benefits, adults should do at least 150 minutes (2 hours and 30 minutes) a week of moderate-intensity, or 75 minutes (1 hour and 15 minutes) a week of vigorous-intensity aerobic physical activity, or an equivalent combination of moderate- and vigorous-intensity aerobic activity. Aerobic activity should be performed in episodes of at least 10 minutes, and preferably, it should be spread throughout the week.  Behavioral modification strategies: increasing lean protein intake and increasing water intake.  Lilliauna has agreed to follow-up with our clinic in 2 weeks. She was informed of the importance of frequent follow-up visits to maximize her success with intensive lifestyle modifications for her multiple health conditions.   Objective:   Blood pressure 122/81, pulse 85, temperature 97.9 F (36.6 C), temperature source Oral, height 5\' 4"  (1.626 m), weight 262 lb (118.8 kg), last menstrual period 07/29/2019, SpO2 99 %, unknown if currently breastfeeding. Body mass index is 44.97 kg/m.  General: Cooperative, alert, well developed, in no acute distress. HEENT: Conjunctivae and lids unremarkable. Cardiovascular: Regular rhythm.  Lungs: Normal work of breathing. Neurologic: No focal deficits.   Lab Results  Component Value Date   CREATININE 0.61 07/01/2019   BUN 11 07/01/2019   NA 140 07/01/2019   K 4.1 07/01/2019   CL 104 07/01/2019   CO2 21 07/01/2019   Lab Results  Component Value Date   ALT 11 07/01/2019   AST 15 07/01/2019   ALKPHOS 77 07/01/2019   BILITOT 0.3 07/01/2019   Lab Results  Component Value Date   HGBA1C 5.3 07/01/2019   Lab Results  Component Value Date   INSULIN 6.2 07/01/2019   Lab Results  Component Value Date   TSH 1.300 07/01/2019   Lab Results  Component Value Date   CHOL 204 (H) 07/01/2019   HDL 49 07/01/2019   LDLCALC 137 (H) 07/01/2019   TRIG 99 07/01/2019   CHOLHDL  3.6 10/15/2017   Lab Results  Component Value Date   WBC 8.1 07/01/2019   HGB 13.1 07/01/2019   HCT 39.0 07/01/2019   MCV 85 07/01/2019   PLT 331 07/01/2019   Lab Results  Component Value Date   FERRITIN 69 12/30/2018   Attestation Statements:   Reviewed by clinician on day of visit: allergies, medications, problem list, medical history, surgical history, family history, social history, and previous encounter notes.  I, Water quality scientist, CMA, am acting as Location manager for [employer], DO.  I have reviewed the above documentation for accuracy and completeness, and I agree with the above. Briscoe Deutscher, DO

## 2019-09-09 ENCOUNTER — Ambulatory Visit (INDEPENDENT_AMBULATORY_CARE_PROVIDER_SITE_OTHER): Payer: No Typology Code available for payment source | Admitting: Family Medicine

## 2019-09-10 ENCOUNTER — Other Ambulatory Visit (INDEPENDENT_AMBULATORY_CARE_PROVIDER_SITE_OTHER): Payer: Self-pay | Admitting: Family Medicine

## 2019-09-10 DIAGNOSIS — E559 Vitamin D deficiency, unspecified: Secondary | ICD-10-CM

## 2019-10-04 ENCOUNTER — Ambulatory Visit (INDEPENDENT_AMBULATORY_CARE_PROVIDER_SITE_OTHER): Payer: No Typology Code available for payment source | Admitting: Family Medicine

## 2019-10-04 ENCOUNTER — Encounter (INDEPENDENT_AMBULATORY_CARE_PROVIDER_SITE_OTHER): Payer: Self-pay | Admitting: Family Medicine

## 2019-10-04 NOTE — Telephone Encounter (Signed)
Please review. Thanks!  

## 2019-10-13 ENCOUNTER — Other Ambulatory Visit: Payer: Self-pay | Admitting: Nurse Practitioner

## 2019-10-13 NOTE — Telephone Encounter (Signed)
Needs f/u appt for more refills, thx. Refill sent today.   Thx,   Dr. Darene Lamer

## 2019-10-13 NOTE — Telephone Encounter (Signed)
12/28/18 last visit for depression

## 2019-10-14 ENCOUNTER — Telehealth: Payer: Self-pay | Admitting: *Deleted

## 2019-10-14 ENCOUNTER — Other Ambulatory Visit: Payer: Self-pay

## 2019-10-14 ENCOUNTER — Encounter: Payer: Self-pay | Admitting: Nurse Practitioner

## 2019-10-14 ENCOUNTER — Telehealth (INDEPENDENT_AMBULATORY_CARE_PROVIDER_SITE_OTHER): Payer: No Typology Code available for payment source | Admitting: Nurse Practitioner

## 2019-10-14 DIAGNOSIS — I83891 Varicose veins of right lower extremities with other complications: Secondary | ICD-10-CM

## 2019-10-14 DIAGNOSIS — F419 Anxiety disorder, unspecified: Secondary | ICD-10-CM | POA: Diagnosis not present

## 2019-10-14 MED ORDER — PRAMIPEXOLE DIHYDROCHLORIDE 1 MG PO TABS: ORAL_TABLET | ORAL | 0 refills | Status: DC

## 2019-10-14 NOTE — Telephone Encounter (Signed)
Ms. girlie, auen are scheduled for a virtual visit with your provider today.    Just as we do with appointments in the office, we must obtain your consent to participate.  Your consent will be active for this visit and any virtual visit you may have with one of our providers in the next 365 days.    If you have a MyChart account, I can also send a copy of this consent to you electronically.  All virtual visits are billed to your insurance company just like a traditional visit in the office.  As this is a virtual visit, video technology does not allow for your provider to perform a traditional examination.  This may limit your provider's ability to fully assess your condition.  If your provider identifies any concerns that need to be evaluated in person or the need to arrange testing such as labs, EKG, etc, we will make arrangements to do so.    Although advances in technology are sophisticated, we cannot ensure that it will always work on either your end or our end.  If the connection with a video visit is poor, we may have to switch to a telephone visit.  With either a video or telephone visit, we are not always able to ensure that we have a secure connection.   I need to obtain your verbal consent now.   Are you willing to proceed with your visit today?   JENIE PANTALONE has provided verbal consent on 10/14/2019 for a virtual visit (video or telephone).   Patsy Lager, LPN [id number]  [id number]

## 2019-10-14 NOTE — Progress Notes (Signed)
   Subjective:    Patient ID: Elizabeth Foster, female    DOB: 12-07-1985, 34 y.o.   MRN: [id number]  HPI Patient calls in today for a check up on her medications. Patient states the Zoloft is working great but she is still struggling with the restless leg on the Mirapex. States she is doing "really good" on Zoloft 75 mg. Denies any adverse effects. Saw a vascular specialist. Defers surgery at this time since it may not help her varicose veins. Want to wear full compression stockings to see if this will help. Taking Mirapex 0.5 mg for RLS at night. New dose worked great for about 2 months but gradually stopped working, same as 0.25 mg dose. Has to get up several times a night. Denies any adverse effects of MIrapex.   Review of Systems     Objective:   Physical Exam Today's visit was via telephone Physical exam was not possible for this visit Alert, oriented. Cheerful affect. Thoughts logical, coherent and relevant.      Virtual Visit via Video Note  I connected with Elizabeth Foster on 10/14/19 at  9:00 AM EDT by a video enabled telemedicine application and verified that I am speaking with the correct person using two identifiers.  Location: Patient: home Provider: office   I discussed the limitations of evaluation and management by telemedicine and the availability of in person appointments. The patient expressed understanding and agreed to proceed.  History of Present Illness: See above   Observations/Objective: See above   Assessment and Plan: Problem List Items Addressed This Visit      Cardiovascular and Mediastinum   Varicose veins of leg with swelling, right    Other Visit Diagnoses    Anxiety    -  Primary     Meds ordered this encounter  Medications  . pramipexole (MIRAPEX) 1 MG tablet    Sig: Take one tab po qhs    Dispense:  90 tablet    Refill:  0    Order Specific Question:   Supervising Provider    Answer:   Sallee Lange A [[id number]]     Follow Up  Instructions: Continue Zoloft as directed. Increase Mirapex to 1 mg at bedtime. Call back if RLS is no better or if any adverse effects.  Rx for full compression stockings sent to [facility]  Return in about 3 months (around 01/14/2020).    I discussed the assessment and treatment plan with the patient. The patient was provided an opportunity to ask questions and all were answered. The patient agreed with the plan and demonstrated an understanding of the instructions.   The patient was advised to call back or seek an in-person evaluation if the symptoms worsen or if the condition fails to improve as anticipated.  I provided 15 minutes of non-face-to-face time during this encounter.   Assessment & Plan:

## 2019-10-27 ENCOUNTER — Ambulatory Visit (INDEPENDENT_AMBULATORY_CARE_PROVIDER_SITE_OTHER): Payer: No Typology Code available for payment source | Admitting: Family Medicine

## 2019-11-15 ENCOUNTER — Telehealth: Payer: No Typology Code available for payment source | Admitting: Emergency Medicine

## 2019-11-15 DIAGNOSIS — R300 Dysuria: Secondary | ICD-10-CM | POA: Diagnosis not present

## 2019-11-15 MED ORDER — CEPHALEXIN 500 MG PO CAPS: 500.0000 mg | ORAL_CAPSULE | Freq: Two times a day (BID) | ORAL | 0 refills | Status: DC

## 2019-11-15 NOTE — Progress Notes (Signed)

## 2019-11-26 ENCOUNTER — Telehealth: Payer: No Typology Code available for payment source | Admitting: Emergency Medicine

## 2019-11-26 DIAGNOSIS — L03818 Cellulitis of other sites: Secondary | ICD-10-CM

## 2019-11-26 MED ORDER — CEPHALEXIN 500 MG PO CAPS: 500.0000 mg | ORAL_CAPSULE | Freq: Four times a day (QID) | ORAL | 0 refills | Status: AC

## 2019-11-26 NOTE — Progress Notes (Signed)
E Visit for Cellulitis  We are sorry that you are not feeling well. Here is how we plan to help!  Based on what you shared with me it looks like you have cellulitis.  Cellulitis looks like areas of skin redness, swelling, and warmth; it develops as a result of bacteria entering under the skin. Little red spots and/or bleeding can be seen in skin, and tiny surface sacs containing fluid can occur. Fever can be present. Cellulitis is almost always on one side of a body, and the lower limbs are the most common site of involvement.   I have prescribed:  Keflex 500mg  take one by mouth four times a day for 5 days  HOME CARE:  . Take your medications as ordered and take all of them, even if the skin irritation appears to be healing.   GET HELP RIGHT AWAY IF:  . Symptoms that don't begin to go away within 48 hours. . Severe redness persists or worsens . If the area turns color, spreads or swells. . If it blisters and opens, develops yellow-brown crust or bleeds. . You develop a fever or chills. . If the pain increases or becomes unbearable.  . Are unable to keep fluids and food down.  MAKE SURE YOU    Understand these instructions.  Will watch your condition.  Will get help right away if you are not doing well or get worse.  Thank you for choosing an e-visit. Your e-visit answers were reviewed by a board certified advanced clinical practitioner to complete your personal care plan. Depending upon the condition, your plan could have included both over the counter or prescription medications. Please review your pharmacy choice. Make sure the pharmacy is open so you can pick up prescription now. If there is a problem, you may contact your provider through [employer] and have the prescription routed to another pharmacy. Your safety is important to [place]. If you have drug allergies check your prescription carefully.  For the next 24 hours you can use MyChart to ask questions about today's  visit, request a non-urgent call back, or ask for a work or school excuse. You will get an email in the next two days asking about your experience. I hope that your e-visit has been valuable and will speed your recovery. Greater than 5 but less than 10 minutes spent researching, coordinating, and implementing care for this patient today

## 2019-11-30 ENCOUNTER — Telehealth: Payer: No Typology Code available for payment source | Admitting: Nurse Practitioner

## 2019-11-30 DIAGNOSIS — B373 Candidiasis of vulva and vagina: Secondary | ICD-10-CM

## 2019-11-30 DIAGNOSIS — B3731 Acute candidiasis of vulva and vagina: Secondary | ICD-10-CM

## 2019-11-30 MED ORDER — FLUCONAZOLE 150 MG PO TABS: 150.0000 mg | ORAL_TABLET | Freq: Once | ORAL | 0 refills | Status: AC

## 2019-11-30 NOTE — Progress Notes (Signed)

## 2020-01-11 ENCOUNTER — Other Ambulatory Visit: Payer: Self-pay | Admitting: Family Medicine

## 2020-01-11 ENCOUNTER — Telehealth: Payer: Self-pay | Admitting: Nurse Practitioner

## 2020-01-11 ENCOUNTER — Other Ambulatory Visit: Payer: Self-pay | Admitting: Nurse Practitioner

## 2020-01-11 NOTE — Telephone Encounter (Signed)
Last med check up may 2021

## 2020-01-11 NOTE — Telephone Encounter (Signed)
May have 90-day on each needs to keep follow-up visit with Hoyle Sauer

## 2020-01-11 NOTE — Telephone Encounter (Signed)
Patient is requesting refills on sertaline 50 mg and pramipexole 1 mg  Called into [facility]. She has medication follow up on 9/17 with Hoyle Sauer.

## 2020-01-12 ENCOUNTER — Other Ambulatory Visit: Payer: Self-pay | Admitting: *Deleted

## 2020-01-12 MED FILL — PRAMIPEXOLE 1 MG TABLET: 1 | 90 days supply | Qty: 90 | Fill #0

## 2020-01-12 MED FILL — SERTRALINE HCL 50 MG TABLET: 50 | 90 days supply | Qty: 135 | Fill #0

## 2020-02-18 ENCOUNTER — Other Ambulatory Visit: Payer: Self-pay

## 2020-02-18 ENCOUNTER — Telehealth (INDEPENDENT_AMBULATORY_CARE_PROVIDER_SITE_OTHER): Payer: No Typology Code available for payment source | Admitting: Nurse Practitioner

## 2020-02-18 ENCOUNTER — Other Ambulatory Visit: Payer: Self-pay | Admitting: Nurse Practitioner

## 2020-02-18 DIAGNOSIS — G2581 Restless legs syndrome: Secondary | ICD-10-CM

## 2020-02-18 DIAGNOSIS — F32A Depression, unspecified: Secondary | ICD-10-CM

## 2020-02-18 DIAGNOSIS — F419 Anxiety disorder, unspecified: Secondary | ICD-10-CM | POA: Insufficient documentation

## 2020-02-18 DIAGNOSIS — F329 Major depressive disorder, single episode, unspecified: Secondary | ICD-10-CM | POA: Diagnosis not present

## 2020-02-18 MED ORDER — SERTRALINE HCL 50 MG PO TABS: ORAL_TABLET | ORAL | 0 refills | Status: DC

## 2020-02-18 MED ORDER — PRAMIPEXOLE DIHYDROCHLORIDE 1 MG PO TABS: ORAL_TABLET | ORAL | 0 refills | Status: DC

## 2020-02-18 NOTE — Progress Notes (Signed)
PHONE VISIT Subjective:    Patient ID: Elizabeth Foster, female    DOB: [birth year], 34 y.o.   MRN: [id number]  HPI pt wants to discuss restless legs.   Virtual Visit via Telephone Note  I connected with Chauncy Lean on 02/18/20 at  9:00 AM EDT by telephone and verified that I am speaking with the correct person using two identifiers.  Location: Patient: home Provider: office   I discussed the limitations, risks, security and privacy concerns of performing an evaluation and management service by telephone and the availability of in person appointments. I also discussed with the patient that there may be a patient responsible charge related to this service. The patient expressed understanding and agreed to proceed.   History of Present Illness: Presents for recheck on her anxiety and restless legs. Feels depression and anxiety is much improved on Zoloft 75 mg. She reports sleep to still be an issue but feels this is complicated by restless leg syndrome.  States she is up and down on a rare basis which interrupts and delays her getting to sleep. Overall Pramipexole is working well.  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 She experiences some leg swelling after shifts. She feels her restless legs is mostly precipitated by caffeine consumption. Warm showers help to alleviate persisting restless legs most occassions. There is a family history of RLS with her mother.She declines sleep aides due to children in the home. She does experience some gastric reflux and has been using omeprazole daily, she feels this is also related to her caffeine consumption and increased weight. She is no longer participating in the weight loss clinic program. Denies heat/cold intolerance, fatigue, or significant unintended weight fluctuations.    Review of Systems  Constitutional: Negative.  Negative for weight loss.  HENT: Negative.   Respiratory: Negative for shortness of breath and  wheezing.   Cardiovascular: Positive for leg swelling. Negative for chest pain and palpitations.  Gastrointestinal: Positive for heartburn. Negative for abdominal pain, nausea and vomiting.  Musculoskeletal: Negative for joint pain.  Skin: Negative.   Neurological: Negative for dizziness and headaches.  Psychiatric/Behavioral: Negative for suicidal ideas. The patient is not nervous/anxious.    Observations/Objective: Via Telephone Visit: Alert, oriented. Thoughts logical coherent and relevant.  Cheerful laughing affect. Able to recall dates appropriately.   Assessment and Plan: Problem List Items Addressed This Visit      Other   Anxiety and depression   Relevant Medications   sertraline (ZOLOFT) 50 MG tablet   Restless leg syndrome - Primary      Meds ordered this encounter  Medications  . pramipexole (MIRAPEX) 1 MG tablet    Sig: TAKE 1 TABLET BY MOUTH AT BEDTIME    Dispense:  90 tablet    Refill:  0    Order Specific Question:   Supervising Provider    Answer:   Sallee Lange A [[id number]]  . sertraline (ZOLOFT) 50 MG tablet    Sig: TAKE 1 AND 1/2 TABLETS BY MOUTH ONCE A DAY    Dispense:  135 tablet    Refill:  0    Order Specific Question:   Supervising Provider    Answer:   Sallee Lange A [[id number]]      Follow Up Instructions: Continue current medications as directed. Discussed lifestyle factors affecting GERD. Recommend reduced caffeine intake and weight loss.  Return in about 4 months (around 06/19/2020) for Labs Med Follow  up.       I discussed the assessment and treatment plan with the patient. The patient was provided an opportunity to ask questions and all were answered. The patient agreed with the plan and demonstrated an understanding of the instructions.   The patient was advised to call back or seek an in-person evaluation if the symptoms worsen or if the condition fails to improve as anticipated.  I provided 15 minutes of non-face-to-face time during this  encounter.

## 2020-02-18 NOTE — Progress Notes (Signed)
° °  Subjective:    Patient ID: Elizabeth Foster, female    DOB: Sep 29, 1985, 34 y.o.   MRN: [id number]  HPIpt wants to discuss restless legs.   Virtual Visit via Telephone Note  I connected with Elizabeth Foster on 02/18/20 at  9:00 AM EDT by telephone and verified that I am speaking with the correct person using two identifiers.  Location: Patient: home Provider: office   I discussed the limitations, risks, security and privacy concerns of performing an evaluation and management service by telephone and the availability of in person appointments. I also discussed with the patient that there may be a patient responsible charge related to this service. The patient expressed understanding and agreed to proceed.   History of Present Illness:    Observations/Objective:   Assessment and Plan:   Follow Up Instructions:    I discussed the assessment and treatment plan with the patient. The patient was provided an opportunity to ask questions and all were answered. The patient agreed with the plan and demonstrated an understanding of the instructions.   The patient was advised to call back or seek an in-person evaluation if the symptoms worsen or if the condition fails to improve as anticipated.        Review of Systems     Objective:   Physical Exam        Assessment & Plan:

## 2020-02-19 ENCOUNTER — Encounter: Payer: Self-pay | Admitting: Nurse Practitioner

## 2020-03-03 ENCOUNTER — Other Ambulatory Visit: Payer: No Typology Code available for payment source

## 2020-04-03 ENCOUNTER — Telehealth: Payer: No Typology Code available for payment source | Admitting: Nurse Practitioner

## 2020-04-03 DIAGNOSIS — R42 Dizziness and giddiness: Secondary | ICD-10-CM

## 2020-04-03 MED ORDER — MECLIZINE HCL 25 MG PO TABS: 25.0000 mg | ORAL_TABLET | Freq: Three times a day (TID) | ORAL | 0 refills | Status: DC | PRN

## 2020-04-03 NOTE — Progress Notes (Signed)
E Visit for Motion Sickness  We are sorry that you are not feeling well. Here is how we plan to help!  Based on what you have shared with me it looks like you have symptoms of motion sickness.  I have prescribed a medication that will help prevent or alleviate your symptoms:  Meclizine 25mg by mouth three times per day as needed for nausea/motion sickness   Prevention:  You might feel better if you keep your eyes focused on outside while you are in motion. For example, if you are in a car, sit in the front and look in the direction you are moving; if you are on a boat, stay on the deck and look to the horizon. This helps make what you see match the movement you are feeling, and so you are less likely to feel sick.  You should also avoid reading, watching a movie, texting or reading messages, or looking at things close to you inside the vehicle you are riding in.  . Use the seat head rest. Lean your head against the back of the seat or head rest when traveling in vehicles with seats to minimize head movements.  . On a ship: When making your reservations, choose a cabin in the middle of the ship and near the waterline. When on board, go up on deck and focus on the horizon.  . In an airplane: Request a window seat and look out the window. A seat over the front edge of the wing is the most preferable spot (the degree of motion is the lowest here). Direct the air vent to blow cool air on your face.  . On a train: Always face forward and sit near a window.  . In a vehicle: Sit in the front seat; if you are the passenger, look at the scenery in the distance. For some people, driving the vehicle (rather than being a passenger) is an instant remedy.  . Avoid others who have become nauseous with motion sickness. Seeing and smelling others who have motion sickness may cause you to become sick.  GET HELP RIGHT AWAY IF:   Your symptoms do not improve or worsen within 2 days after  treatment.   You cannot keep down fluids after trying the medication.   Other associated symptoms such as severe headache, visual field changes, fever, or intractable nausea and vomiting.  MAKE SURE YOU:   Understand these instructions.  Will watch your condition.  Will get help right away if you are not doing well or get worse.  Thank you for choosing an e-visit.  Your e-visit answers were reviewed by a board certified advanced clinical practitioner to complete your personal care plan. Depending upon the condition, your plan could have included both over the counter or prescription medications.  Please review your pharmacy choice. Be sure that the pharmacy you have chosen is open so that you can pick up your prescription now.  If there is a problem you may message your provider in MyChart to have the prescription routed to another pharmacy.  Your safety is important to us. If you have drug allergies check your prescription carefully.   For the next 24 hours, you can use MyChart to ask questions about today's visit, request a non-urgent call back, or ask for a work or school excuse from your e-visit provider.  You will get an e-mail in the next two days asking about your experience. I hope that your e-visit has been valuable and will speed   5-10 minutes spent reviewing and documenting in chart.  

## 2020-04-10 MED FILL — SERTRALINE HCL 50 MG TABLET: 50 | 90 days supply | Qty: 135 | Fill #0

## 2020-04-10 MED FILL — PRAMIPEXOLE 1 MG TABLET: 1 | 90 days supply | Qty: 90 | Fill #0

## 2020-05-29 ENCOUNTER — Ambulatory Visit (INDEPENDENT_AMBULATORY_CARE_PROVIDER_SITE_OTHER): Payer: No Typology Code available for payment source

## 2020-05-29 ENCOUNTER — Ambulatory Visit
Admission: EM | Admit: 2020-05-29 | Discharge: 2020-05-29 | Disposition: A | Discharge status: Home / Self Care | Payer: No Typology Code available for payment source | Attending: Internal Medicine | Admitting: Internal Medicine

## 2020-05-29 DIAGNOSIS — S82891A Other fracture of right lower leg, initial encounter for closed fracture: Secondary | ICD-10-CM

## 2020-05-29 DIAGNOSIS — M25571 Pain in right ankle and joints of right foot: Secondary | ICD-10-CM | POA: Diagnosis not present

## 2020-05-29 MED ORDER — IBUPROFEN 600 MG PO TABS: 600.0000 mg | ORAL_TABLET | Freq: Four times a day (QID) | ORAL | 0 refills | Status: DC | PRN

## 2020-05-29 NOTE — ED Triage Notes (Signed)
Pt presents with right ankle injury that happened on Thursday night

## 2020-05-29 NOTE — ED Provider Notes (Signed)
[facility]    CSN: [id number] Arrival date & time: 05/29/20  1245      History   Chief Complaint Chief Complaint  Patient presents with  . Ankle Pain    HPI Elizabeth Foster is a 34 y.o. female comes to the urgent care with complaints of right ankle pain of 4 to 5 days duration.  Patient was carrying a dresser down a flight of stairs when she lost a step and twisted her ankle.  Pain was of sudden onset, sharp, throbbing, aggravated by movement and without any relieving factors.  It is associated with some swelling of the right ankle but no bruising.  She has some numbness in the right foot.  He has tried ibuprofen for pain, elevation and icing with no improvement in her symptoms.Marland Kitchen   HPI  Past Medical History:  Diagnosis Date  . Allergy   . Anxiety   . Back pain   . Chronic kidney disease    chronic uti and kidney infections  . GERD (gastroesophageal reflux disease)   . Hypertension during pregnancy   . Knee pain   . Lower extremity edema   . Obesity   . RLS (restless legs syndrome)   . Varicose vein of leg     Patient Active Problem List   Diagnosis Date Noted  . Anxiety and depression 02/18/2020  . Varicose veins of leg with swelling, right 12/28/2018  . Restless leg syndrome 12/28/2018  . S/P cesarean section 04/06/2017  . Normal labor 01/30/2014  . NSVD (normal spontaneous vaginal delivery) 01/30/2014    Past Surgical History:  Procedure Laterality Date  . ADENOIDECTOMY    . CESAREAN SECTION WITH BILATERAL TUBAL LIGATION N/A 04/05/2017   Procedure: CESAREAN SECTION WITH BILATERAL TUBAL LIGATION;  Surgeon: Marylynn Pearson, MD;  Location: [facility];  Service: Obstetrics;  Laterality: N/A;  . KNEE ARTHROSCOPY     x2  . MIDDLE EAR SURGERY     benign growth removed from eardrum  . TONSILLECTOMY      OB History    Gravida  3   Para  3   Term  3   Preterm      AB      Living  3     SAB      IAB      Ectopic      Multiple   0   Live Births  3            Home Medications    Prior to Admission medications   Medication Sig Start Date End Date Taking? Authorizing Provider  ibuprofen (ADVIL) 600 MG tablet Take 1 tablet (600 mg total) by mouth every 6 (six) hours as needed. 05/29/20  Yes Rasul Decola, Myrene Galas, MD  acetaminophen (TYLENOL) 325 MG tablet Take 650 mg every 6 (six) hours as needed by mouth for moderate pain.    [provider]  Biotin 10000 MCG TABS Take 1 tablet by mouth every 3 (three) days.    [provider]  Cranberry 1000 MG CAPS Take by mouth.    [provider]  fexofenadine (ALLEGRA ODT) 30 MG disintegrating tablet Take 30 mg by mouth daily.    [provider]  fluticasone (FLONASE) 50 MCG/ACT nasal spray Place into both nostrils daily.    [provider]  meclizine (ANTIVERT) 25 MG tablet Take 1 tablet (25 mg total) by mouth 3 (three) times daily as needed for dizziness. 04/03/20   Hassell Done,  Mary-Margaret, FNP  Multiple Vitamin (MULTIVITAMIN) capsule Take 1 capsule by mouth daily.    [provider]  Omega-3 Fatty Acids (FISH OIL) 1000 MG CAPS Take by mouth.    [provider]  omeprazole (PRILOSEC) 20 MG capsule Take 20 mg by mouth daily.    [provider]  pramipexole (MIRAPEX) 1 MG tablet TAKE 1 TABLET BY MOUTH AT BEDTIME 02/18/20   Nilda Simmer, NP  sertraline (ZOLOFT) 50 MG tablet TAKE 1 AND 1/2 TABLETS BY MOUTH ONCE A DAY 02/18/20   Nilda Simmer, NP  VITAMIN D PO Take by mouth. 2,000 iu daily    [provider]    Family History Family History  Problem Relation Age of Onset  . Anemia Mother   . Thyroid disease Mother   . Diabetes Mother   . Sleep apnea Mother   . Depression Mother   . Depression Father   . COPD Maternal Grandmother   . Thyroid disease Maternal Grandmother   . COPD Maternal Grandfather   . Heart disease Paternal Grandfather   . Diabetes Paternal Grandfather   . Anesthesia  problems Neg Hx   . Hypotension Neg Hx   . Malignant hyperthermia Neg Hx   . Pseudochol deficiency Neg Hx     Social History Social History   Tobacco Use  . Smoking status: Never Smoker  . Smokeless tobacco: Never Used  Vaping Use  . Vaping Use: Never used  Substance Use Topics  . Alcohol use: No  . Drug use: No     Allergies   Colace [docusate calcium], Polytrim [polymyxin b-trimethoprim], and Amoxicillin   Review of Systems Review of Systems  Musculoskeletal: Positive for arthralgias. Negative for myalgias, neck pain and neck stiffness.  Skin: Negative.      Physical Exam Triage Vital Signs ED Triage Vitals  Enc Vitals Group     BP 05/29/20 1253 117/86     Pulse Rate 05/29/20 1253 80     Resp 05/29/20 1253 20     Temp 05/29/20 1253 (!) 96.8 F (36 C)     Temp src --      SpO2 05/29/20 1253 98 %     Weight --      Height --      Head Circumference --      Peak Flow --      Pain Score 05/29/20 1248 7     Pain Loc --      Pain Edu? --      Excl. in [place]? --    No data found.  Updated Vital Signs BP 117/86   Pulse 80   Temp (!) 96.8 F (36 C)   Resp 20   LMP 05/17/2020 (LMP Unknown)   SpO2 98%   Visual Acuity Right Eye Distance:   Left Eye Distance:   Bilateral Distance:    Right Eye Near:   Left Eye Near:    Bilateral Near:     Physical Exam Vitals and nursing note reviewed.  Constitutional:      General: She is not in acute distress.    Appearance: She is not ill-appearing.  Musculoskeletal:     Comments: Tenderness on palpation over the medial malleolus as well as the lateral malleolus.  No swelling more pronounced over the lateral malleolus.  No bruising.  Neurological:     Mental Status: She is alert.      UC Treatments / Results  Labs (all labs ordered are listed,  but only abnormal results are displayed) Labs Reviewed - No data to display  EKG   Radiology DG Ankle Complete Right  Result Date: 05/29/2020 CLINICAL DATA:   Ankle injury 5 days ago, pain and swelling to entire ankle. EXAM: RIGHT ANKLE - COMPLETE 3+ VIEW COMPARISON:  None. FINDINGS: Osseous alignment is normal. Ankle mortise is symmetric. Small avulsion fracture fragment underlying the medial malleolus, of uncertain age but most likely acute. No additional fracture line or displaced fracture fragment is seen. Visualized portions of the midfoot and hindfoot appear intact and normally aligned. Soft tissue swelling/edema overlying the lateral malleolus. IMPRESSION: 1. Small avulsion fracture fragment underlying the medial malleolus, of uncertain age but most likely acute. 2. Soft tissue swelling/edema. Electronically Signed   By: Franki Cabot M.D.   On: 05/29/2020 13:18    Procedures Procedures (including critical care time)  Medications Ordered in UC Medications - No data to display  Initial Impression / Assessment and Plan / UC Course  I have reviewed the triage vital signs and the nursing notes.  Pertinent labs & imaging results that were available during my care of the patient were reviewed by me and considered in my medical decision making (see chart for details).     1.  Small avulsion fracture involving the medial malleolus of the right ankle: Cam boot Continue ibuprofen and alternate with Tylenol Rest, ice, elevate and gentle range of motion exercises I spoke with the orthopedic surgeon regarding the ankle fracture and patient will follow up with him in the office tomorrow. Work note has been given. Final Clinical Impressions(s) / UC Diagnoses   Final diagnoses:  Avulsion fracture of right ankle, closed, initial encounter     Discharge Instructions     Ibuprofen as needed for pain Continue icing and elevating Please make an appointment with the orthopedic surgeon to be seen tomorrow-I have already spoken with him and he has an opening in the office tomorrow.   ED Prescriptions    Medication Sig Dispense Auth. Provider    ibuprofen (ADVIL) 600 MG tablet Take 1 tablet (600 mg total) by mouth every 6 (six) hours as needed. 30 tablet Jayvon Mounger, Myrene Galas, MD     PDMP not reviewed this encounter.   Chase Picket, MD 05/29/20 [phone]

## 2020-05-29 NOTE — Discharge Instructions (Signed)
Ibuprofen as needed for pain Continue icing and elevating Please make an appointment with the orthopedic surgeon to be seen tomorrow-I have already spoken with him and he has an opening in the office tomorrow.

## 2020-05-30 ENCOUNTER — Other Ambulatory Visit (HOSPITAL_COMMUNITY): Payer: Self-pay | Admitting: Physician Assistant

## 2020-05-30 MED FILL — DICLOFENAC SOD EC 50 MG TAB: 50 | 30 days supply | Qty: 60 | Fill #0

## 2020-05-31 ENCOUNTER — Ambulatory Visit: Payer: No Typology Code available for payment source | Admitting: Orthopedic Surgery

## 2020-06-14 ENCOUNTER — Other Ambulatory Visit: Payer: Self-pay | Admitting: Physician Assistant

## 2020-06-14 DIAGNOSIS — S93401D Sprain of unspecified ligament of right ankle, subsequent encounter: Secondary | ICD-10-CM

## 2020-06-14 DIAGNOSIS — S82891D Other fracture of right lower leg, subsequent encounter for closed fracture with routine healing: Secondary | ICD-10-CM

## 2020-07-08 ENCOUNTER — Other Ambulatory Visit: Payer: Self-pay | Admitting: Nurse Practitioner

## 2020-07-08 ENCOUNTER — Other Ambulatory Visit: Payer: Self-pay | Admitting: Family Medicine

## 2020-07-08 MED FILL — DICLOFENAC SOD EC 50 MG TAB: 50 | 30 days supply | Qty: 60 | Fill #1

## 2020-07-10 ENCOUNTER — Other Ambulatory Visit: Payer: Self-pay | Admitting: Nurse Practitioner

## 2020-07-10 MED FILL — SERTRALINE HCL 50 MG TABS: 50 | 90 days supply | Qty: 135 | Fill #0

## 2020-07-10 NOTE — Telephone Encounter (Signed)
Last visit 02-18-20

## 2020-07-11 ENCOUNTER — Other Ambulatory Visit: Payer: Self-pay | Admitting: Family Medicine

## 2020-07-11 MED FILL — PRAMIPEXOLE 1 MG TABLET: 1 | 90 days supply | Qty: 90 | Fill #0

## 2020-07-13 ENCOUNTER — Encounter: Payer: Self-pay | Admitting: Family Medicine

## 2020-08-24 ENCOUNTER — Other Ambulatory Visit (HOSPITAL_BASED_OUTPATIENT_CLINIC_OR_DEPARTMENT_OTHER): Payer: Self-pay

## 2020-09-12 ENCOUNTER — Other Ambulatory Visit (HOSPITAL_COMMUNITY): Payer: Self-pay

## 2020-10-06 ENCOUNTER — Other Ambulatory Visit: Payer: Self-pay | Admitting: Physician Assistant

## 2020-10-06 ENCOUNTER — Other Ambulatory Visit (HOSPITAL_COMMUNITY): Payer: Self-pay

## 2020-10-06 ENCOUNTER — Other Ambulatory Visit: Payer: Self-pay | Admitting: Nurse Practitioner

## 2020-10-06 MED FILL — Pramipexole Dihydrochloride Tab 1 MG: ORAL | 90 days supply | Qty: 90 | Fill #0 | Status: AC

## 2020-10-08 MED ORDER — SERTRALINE HCL 50 MG PO TABS
ORAL_TABLET | Freq: Every day | ORAL | 0 refills | Status: DC
  Filled 2020-10-08: qty 135, 90d supply, fill #0

## 2020-10-09 ENCOUNTER — Other Ambulatory Visit: Payer: Self-pay | Admitting: Physician Assistant

## 2020-10-09 ENCOUNTER — Other Ambulatory Visit (HOSPITAL_COMMUNITY): Payer: Self-pay

## 2020-10-10 ENCOUNTER — Other Ambulatory Visit (HOSPITAL_COMMUNITY): Payer: Self-pay

## 2020-10-11 ENCOUNTER — Other Ambulatory Visit (HOSPITAL_COMMUNITY): Payer: Self-pay

## 2020-10-11 ENCOUNTER — Other Ambulatory Visit: Payer: Self-pay

## 2020-10-12 ENCOUNTER — Other Ambulatory Visit (HOSPITAL_COMMUNITY): Payer: Self-pay

## 2020-10-17 ENCOUNTER — Other Ambulatory Visit: Payer: Self-pay | Admitting: Physician Assistant

## 2020-10-17 ENCOUNTER — Other Ambulatory Visit (HOSPITAL_COMMUNITY): Payer: Self-pay

## 2020-10-18 ENCOUNTER — Other Ambulatory Visit (HOSPITAL_COMMUNITY): Payer: Self-pay

## 2020-10-18 ENCOUNTER — Other Ambulatory Visit: Payer: Self-pay | Admitting: Physician Assistant

## 2020-10-19 ENCOUNTER — Other Ambulatory Visit (HOSPITAL_COMMUNITY): Payer: Self-pay

## 2020-11-16 ENCOUNTER — Other Ambulatory Visit (HOSPITAL_COMMUNITY): Payer: Self-pay

## 2020-11-16 ENCOUNTER — Other Ambulatory Visit: Payer: Self-pay

## 2020-11-16 ENCOUNTER — Ambulatory Visit (INDEPENDENT_AMBULATORY_CARE_PROVIDER_SITE_OTHER): Payer: No Typology Code available for payment source | Admitting: Family Medicine

## 2020-11-16 VITALS — BP 127/87 | HR 91 | Temp 97.7°F | Ht 64.0 in | Wt 302.0 lb

## 2020-11-16 DIAGNOSIS — R635 Abnormal weight gain: Secondary | ICD-10-CM | POA: Diagnosis not present

## 2020-11-16 DIAGNOSIS — R197 Diarrhea, unspecified: Secondary | ICD-10-CM | POA: Diagnosis not present

## 2020-11-16 DIAGNOSIS — R0609 Other forms of dyspnea: Secondary | ICD-10-CM

## 2020-11-16 DIAGNOSIS — E785 Hyperlipidemia, unspecified: Secondary | ICD-10-CM

## 2020-11-16 DIAGNOSIS — E559 Vitamin D deficiency, unspecified: Secondary | ICD-10-CM

## 2020-11-16 DIAGNOSIS — U099 Post covid-19 condition, unspecified: Secondary | ICD-10-CM

## 2020-11-16 DIAGNOSIS — R0600 Dyspnea, unspecified: Secondary | ICD-10-CM

## 2020-11-16 MED ORDER — PRAMIPEXOLE DIHYDROCHLORIDE 1 MG PO TABS
ORAL_TABLET | Freq: Every day | ORAL | 1 refills | Status: DC
  Filled 2020-11-16: qty 90, fill #0
  Filled 2020-12-28: qty 90, 90d supply, fill #0
  Filled 2021-04-05: qty 90, 90d supply, fill #1

## 2020-11-16 MED ORDER — SERTRALINE HCL 50 MG PO TABS
ORAL_TABLET | Freq: Every day | ORAL | 1 refills | Status: DC
  Filled 2020-11-16: qty 135, fill #0
  Filled 2020-12-28: qty 135, 90d supply, fill #0
  Filled 2021-04-05: qty 135, 90d supply, fill #1

## 2020-11-16 NOTE — Progress Notes (Signed)
   Subjective:    Patient ID: Elizabeth Foster, female    DOB: [birth year], 35 y.o.   MRN: [id number]  HPI Covid follow up . Had infection 3 times now Very nice patient Has had multiple bouts of COVID She states she does do her best to try to stay healthy She suffers with morbid obesity She wonders if she has had any damage from the [place] Her last Pap was in May 2022 she had significant shortness of breath body aches fever sweats and chills most of her symptoms have improved but she has been left with a slight cough as well as shortness of breath with activity denies tachycardia no passing out Check possible low vitamin D    Review of Systems     Objective:   Physical Exam  General-in no acute distress Eyes-no discharge Lungs-respiratory rate normal, CTA CV-no murmurs,RRR Extremities skin warm dry no edema Neuro grossly normal Behavior normal, alert       Assessment & Plan:  1. Diarrhea, unspecified type She does suffer with frequent diarrhea could be IBS with diarrhea.  I doubt that it is ulcerative colitis.  Check for celiac disease.  This is been going on for months. - Lipid panel - VITAMIN D 25 Hydroxy (Vit-D Deficiency, Fractures) - Comprehensive metabolic panel - Hemoglobin A1c - Tissue transglutaminase, IgA  2. Vitamin D deficiency Suffers with vitamin D deficiency check lab work May need medication - Lipid panel - VITAMIN D 25 Hydroxy (Vit-D Deficiency, Fractures) - Comprehensive metabolic panel - Hemoglobin A1c - Tissue transglutaminase, IgA  3. Hyperlipidemia, unspecified hyperlipidemia type Has a history of hyperlipidemia check lipid profile await the results  4. Weight gain Given her weight gain we will check her sugar and A1c to rule out diabetes - Lipid panel - VITAMIN D 25 Hydroxy (Vit-D Deficiency, Fractures) - Comprehensive metabolic panel - Hemoglobin A1c - Tissue transglutaminase, IgA  5. DOE (dyspnea on exertion) Shortness of breath more  than likely related to COVID that she had she will send [place] an update within 30 days if she is not dramatically better recommend PFT I doubt pulmonary embolus.  Patient not having tachycardia hypoxia or chest pains - Lipid panel - VITAMIN D 25 Hydroxy (Vit-D Deficiency, Fractures) - Comprehensive metabolic panel - Hemoglobin A1c - Tissue transglutaminase, IgA  6. Post-COVID chronic dyspnea Please see per above - Lipid panel - VITAMIN D 25 Hydroxy (Vit-D Deficiency, Fractures) - Comprehensive metabolic panel - Hemoglobin A1c - Tissue transglutaminase, IgA

## 2020-11-21 LAB — COMPREHENSIVE METABOLIC PANEL
ALT: 12 IU/L (ref 0–32)
AST: 15 IU/L (ref 0–40)
Albumin/Globulin Ratio: 1.6 (ref 1.2–2.2)
Albumin: 4.5 g/dL (ref 3.8–4.8)
Alkaline Phosphatase: 78 IU/L (ref 44–121)
BUN/Creatinine Ratio: 11 (ref 9–23)
BUN: 7 mg/dL (ref 6–20)
Bilirubin Total: 0.2 mg/dL (ref 0.0–1.2)
CO2: 22 mmol/L (ref 20–29)
Calcium: 9.2 mg/dL (ref 8.7–10.2)
Chloride: 104 mmol/L (ref 96–106)
Creatinine, Ser: 0.66 mg/dL (ref 0.57–1.00)
Globulin, Total: 2.8 g/dL (ref 1.5–4.5)
Glucose: 109 mg/dL — ABNORMAL HIGH (ref 65–99)
Potassium: 3.8 mmol/L (ref 3.5–5.2)
Sodium: 141 mmol/L (ref 134–144)
Total Protein: 7.3 g/dL (ref 6.0–8.5)
eGFR: 117 mL/min/{1.73_m2} (ref >59)

## 2020-11-21 LAB — LIPID PANEL
Chol/HDL Ratio: 4.4 ratio (ref 0.0–4.4)
Cholesterol, Total: 201 mg/dL — ABNORMAL HIGH (ref 100–199)
HDL: 46 mg/dL (ref >39)
LDL Chol Calc (NIH): 125 mg/dL — ABNORMAL HIGH (ref 0–99)
Triglycerides: 169 mg/dL — ABNORMAL HIGH (ref 0–149)
VLDL Cholesterol Cal: 30 mg/dL (ref 5–40)

## 2020-11-21 LAB — TISSUE TRANSGLUTAMINASE, IGA: Transglutaminase IgA: <2 U/mL (ref 0–3)

## 2020-11-21 LAB — HEMOGLOBIN A1C
Est. average glucose Bld gHb Est-mCnc: 117 mg/dL
Hgb A1c MFr Bld: 5.7 % — ABNORMAL HIGH (ref 4.8–5.6)

## 2020-11-21 LAB — VITAMIN D 25 HYDROXY (VIT D DEFICIENCY, FRACTURES): Vit D, 25-Hydroxy: 35.1 ng/mL (ref 30.0–100.0)

## 2020-11-29 ENCOUNTER — Encounter: Payer: Self-pay | Admitting: Family Medicine

## 2020-12-05 ENCOUNTER — Other Ambulatory Visit: Payer: Self-pay | Admitting: *Deleted

## 2020-12-05 DIAGNOSIS — R195 Other fecal abnormalities: Secondary | ICD-10-CM

## 2020-12-05 NOTE — Telephone Encounter (Signed)
Nurses  I would recommend stool culture and stool fecal calprotectin  I would also recommend consultation with gastroenterology-May go ahead with referral  She may use Imodium as needed  Certainly her level of symptoms and problems are causing significant hardship for her.  We will mail to her additional dietary things she can do.  Also if she is taking significant amount of dairy products I would recommend minimizing them or using Lactaid capsules with dairy products.  As for finding out what is causing this sometimes test will not reveal the actual cause but it would be felt to be due to irritable bowel with diarrhea and in no situations Imodium as needed and dietary measures are the best approach.  Gastroenterology will help with this evaluation  Thanks-Dr. Nicki Reaper

## 2020-12-11 ENCOUNTER — Encounter: Payer: Self-pay | Admitting: Internal Medicine

## 2020-12-25 ENCOUNTER — Encounter: Payer: Self-pay | Admitting: Family Medicine

## 2020-12-28 ENCOUNTER — Other Ambulatory Visit (HOSPITAL_COMMUNITY): Payer: Self-pay

## 2020-12-29 ENCOUNTER — Other Ambulatory Visit (HOSPITAL_COMMUNITY): Payer: Self-pay

## 2021-01-01 ENCOUNTER — Other Ambulatory Visit (HOSPITAL_COMMUNITY): Payer: Self-pay

## 2021-01-01 ENCOUNTER — Other Ambulatory Visit: Payer: Self-pay | Admitting: Family Medicine

## 2021-01-01 MED ORDER — SAXENDA 18 MG/3ML ~~LOC~~ SOPN
PEN_INJECTOR | SUBCUTANEOUS | 0 refills | Status: DC
  Filled 2021-01-01: qty 15, 30d supply, fill #0

## 2021-01-03 ENCOUNTER — Telehealth: Payer: Self-pay | Admitting: Family Medicine

## 2021-01-03 ENCOUNTER — Other Ambulatory Visit: Payer: Self-pay

## 2021-01-03 ENCOUNTER — Telehealth (INDEPENDENT_AMBULATORY_CARE_PROVIDER_SITE_OTHER): Payer: No Typology Code available for payment source | Admitting: Family Medicine

## 2021-01-03 VITALS — Ht 64.0 in | Wt 305.0 lb

## 2021-01-03 DIAGNOSIS — Z6841 Body Mass Index (BMI) 40.0 and over, adult: Secondary | ICD-10-CM | POA: Diagnosis not present

## 2021-01-03 DIAGNOSIS — R635 Abnormal weight gain: Secondary | ICD-10-CM

## 2021-01-03 NOTE — Telephone Encounter (Signed)
[name], skalka are scheduled for a virtual visit with your provider today.    Just as we do with appointments in the office, we must obtain your consent to participate.  Your consent will be active for this visit and any virtual visit you may have with one of our providers in the next 365 days.    If you have a MyChart account, I can also send a copy of this consent to you electronically.  All virtual visits are billed to your insurance company just like a traditional visit in the office.  As this is a virtual visit, video technology does not allow for your provider to perform a traditional examination.  This may limit your provider's ability to fully assess your condition.  If your provider identifies any concerns that need to be evaluated in person or the need to arrange testing such as labs, EKG, etc, we will make arrangements to do so.    Although advances in technology are sophisticated, we cannot ensure that it will always work on either your end or our end.  If the connection with a video visit is poor, we may have to switch to a telephone visit.  With either a video or telephone visit, we are not always able to ensure that we have a secure connection.   I need to obtain your verbal consent now.   Are you willing to proceed with your visit today?   Elizabeth Foster has provided verbal consent on 01/03/2021 for a virtual visit (video or telephone).   [name], LPN [phone]  [phone] AM

## 2021-01-03 NOTE — Progress Notes (Signed)
Subjective:    Patient ID: Elizabeth Foster, female    DOB: [birth year], 35 y.o.   MRN: [id number] Telephone only video not possible HPI Pt would like to begin to take [place]. Pt did speak with pharmacy and insurance. Kirke Shaggy will require Prior Authorization.   Virtual Visit via Telephone Note  I connected with Elizabeth Foster on 01/03/21 at  8:40 AM EDT by telephone and verified that I am speaking with the correct person using two identifiers.  Location: Patient: home Provider: office   I discussed the limitations, risks, security and privacy concerns of performing an evaluation and management service by telephone and the availability of in person appointments. I also discussed with the patient that there may be a patient responsible charge related to this service. The patient expressed understanding and agreed to proceed.   History of Present Illness: Very nice patient Suffers with morbid obesity Unfortunately her BMI is 52 Through the years she has tried various measures She tried weight watchers and unfortunately did not lose weight with that She has tried portion control avoiding starches avoiding sugary drinks in addition to this pitting in regular fitness and activity Despite this she did not lose weight She also wanted a healthy and wellness center and was on phenteramine for a period of time and lost minimal amount of weight and gained it back now she is at the heaviest she has ever been at 305 Observations/Objective:   Assessment and Plan:   Follow Up Instructions:    I discussed the assessment and treatment plan with the patient. The patient was provided an opportunity to ask questions and all were answered. The patient agreed with the plan and demonstrated an understanding of the instructions.   The patient was advised to call back or seek an in-person evaluation if the symptoms worsen or if the condition fails to improve as anticipated.  I provided  25 minutes of  non-face-to-face time during this encounter.   [name], LPN   Review of Systems     Objective:   Physical Exam  Today's visit was via telephone Physical exam was not possible for this visit       Assessment & Plan:  Morbid obesity Prediabetes Hyperlipidemia  Patient has tried numerous attempts to lose weight see documentation above Her weight puts her at extreme risk of developing diabetes and other comorbidities that could not only impact her quality of life but could impact her longevity.  Getting her weight under control is imperative Regular measures including portion control physical activity use the phenteramine use a program such as weight watchers and healthy weight and wellness have not been successful at significant weight loss.  In fact over the past 2 years her weight is gone up over 50 pounds  We did discuss Saxenda.  We also discussed side effects.  Warning signs to watch for.  Risk of thyroid medullary cancer-she has no family history of this.  Based upon all this I would recommend 0.6 mg once weekly each week can go up on the dose.  Target is to get to 3 mg weekly.  Patient was instructed if she has symptoms that are severe such as severe nausea vomiting diarrhea belly pain she should not go up on the dose and she should consult with [place].  Patient will send [place] weekly updates.  Patient will do a follow-up office visit in 4 to 6 weeks to document how she is doing.  Patient understands that portion  control, 0-calorie liquids, 150 minutes of moderate activity weekly are all necessary for ongoing success.  Patient also aware that if on the maximum dose for several months not seeing weight loss that would be a sign to stop the medicine.  If she is having significant weight loss we will continue the medicine.

## 2021-01-04 ENCOUNTER — Other Ambulatory Visit (HOSPITAL_COMMUNITY): Payer: Self-pay

## 2021-01-04 ENCOUNTER — Other Ambulatory Visit: Payer: Self-pay

## 2021-01-04 DIAGNOSIS — Z6841 Body Mass Index (BMI) 40.0 and over, adult: Secondary | ICD-10-CM

## 2021-01-04 MED ORDER — SAXENDA 18 MG/3ML ~~LOC~~ SOPN
0.6000 mg | PEN_INJECTOR | SUBCUTANEOUS | 0 refills | Status: DC
  Filled 2021-01-04: qty 15, 30d supply, fill #0

## 2021-01-05 ENCOUNTER — Other Ambulatory Visit (HOSPITAL_COMMUNITY): Payer: Self-pay

## 2021-01-08 ENCOUNTER — Other Ambulatory Visit (HOSPITAL_COMMUNITY): Payer: Self-pay

## 2021-01-11 ENCOUNTER — Other Ambulatory Visit (HOSPITAL_COMMUNITY): Payer: Self-pay

## 2021-01-11 MED ORDER — INSULIN PEN NEEDLE 32G X 4 MM MISC
0 refills | Status: DC
  Filled 2021-01-11: qty 100, 90d supply, fill #0

## 2021-01-12 ENCOUNTER — Other Ambulatory Visit (HOSPITAL_COMMUNITY): Payer: Self-pay

## 2021-01-12 ENCOUNTER — Other Ambulatory Visit: Payer: Self-pay

## 2021-01-12 MED ORDER — SAXENDA 18 MG/3ML ~~LOC~~ SOPN
PEN_INJECTOR | SUBCUTANEOUS | 0 refills | Status: DC
  Filled 2021-01-12: qty 15, 30d supply, fill #0

## 2021-01-15 ENCOUNTER — Other Ambulatory Visit (HOSPITAL_COMMUNITY): Payer: Self-pay

## 2021-01-16 ENCOUNTER — Other Ambulatory Visit (HOSPITAL_COMMUNITY): Payer: Self-pay

## 2021-01-17 ENCOUNTER — Encounter: Payer: Self-pay | Admitting: Family Medicine

## 2021-01-24 ENCOUNTER — Encounter: Payer: Self-pay | Admitting: Family Medicine

## 2021-02-01 ENCOUNTER — Encounter: Payer: Self-pay | Admitting: Family Medicine

## 2021-02-07 ENCOUNTER — Encounter: Payer: Self-pay | Admitting: Family Medicine

## 2021-02-14 ENCOUNTER — Encounter: Payer: Self-pay | Admitting: Family Medicine

## 2021-02-21 ENCOUNTER — Encounter: Payer: Self-pay | Admitting: Family Medicine

## 2021-02-23 ENCOUNTER — Encounter: Payer: Self-pay | Admitting: Family Medicine

## 2021-02-23 ENCOUNTER — Telehealth: Payer: Self-pay | Admitting: Family Medicine

## 2021-02-23 NOTE — Telephone Encounter (Signed)
Pt called into to ask about Saxenda side effects. Pt states she has had sulfur tasting burps, burping all day, vomiting due to sulfur taste, diarrhea x2 today. Did not take injection this morning. Pt is wanting to know if she should continue the [place] or what is the next course of action? If she skips injections will it cause any issues?  Please advise. Thank you  (Pt prefers call back at [phone])

## 2021-02-24 NOTE — Telephone Encounter (Signed)
Please see mychart message.

## 2021-02-26 NOTE — Telephone Encounter (Signed)
Pt seen MyChart message and did reply

## 2021-03-07 ENCOUNTER — Encounter: Payer: Self-pay | Admitting: Internal Medicine

## 2021-03-08 ENCOUNTER — Other Ambulatory Visit (HOSPITAL_COMMUNITY): Payer: Self-pay

## 2021-03-08 ENCOUNTER — Other Ambulatory Visit: Payer: Self-pay | Admitting: Family Medicine

## 2021-03-08 MED ORDER — SAXENDA 18 MG/3ML ~~LOC~~ SOPN
PEN_INJECTOR | SUBCUTANEOUS | 1 refills | Status: DC
  Filled 2021-03-08: qty 15, 30d supply, fill #0

## 2021-03-08 MED ORDER — UNIFINE PENTIPS 32G X 4 MM MISC
0 refills | Status: DC
  Filled 2021-03-08: qty 100, fill #0

## 2021-03-08 NOTE — Telephone Encounter (Signed)
90-day on refill on medication As needed refills on the needles Follow-up office visit by December

## 2021-04-06 ENCOUNTER — Other Ambulatory Visit (HOSPITAL_COMMUNITY): Payer: Self-pay

## 2021-04-09 ENCOUNTER — Other Ambulatory Visit (HOSPITAL_COMMUNITY): Payer: Self-pay

## 2021-04-11 ENCOUNTER — Ambulatory Visit: Payer: No Typology Code available for payment source | Admitting: Internal Medicine

## 2021-04-13 ENCOUNTER — Ambulatory Visit: Payer: No Typology Code available for payment source | Admitting: Gastroenterology

## 2021-05-14 IMAGING — US ULTRASOUND ABDOMEN LIMITED
1 series · 14 of 25 positions shown · non-contrast
Comparison: None.

CLINICAL DATA: Right upper quadrant pain

EXAM:
ULTRASOUND ABDOMEN LIMITED RIGHT UPPER QUADRANT

[Series 1: ultrasound abdomen limited · 0.19mm/px · 14 of 48 slices shown]
[im 1/48]
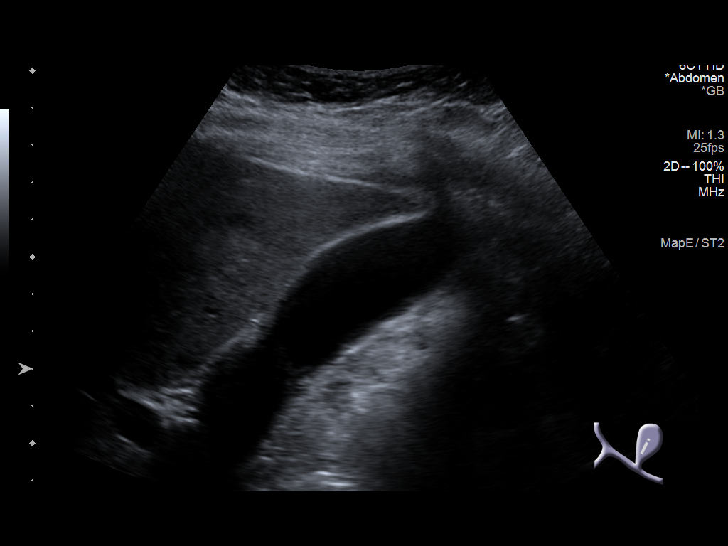
[im 4/48]
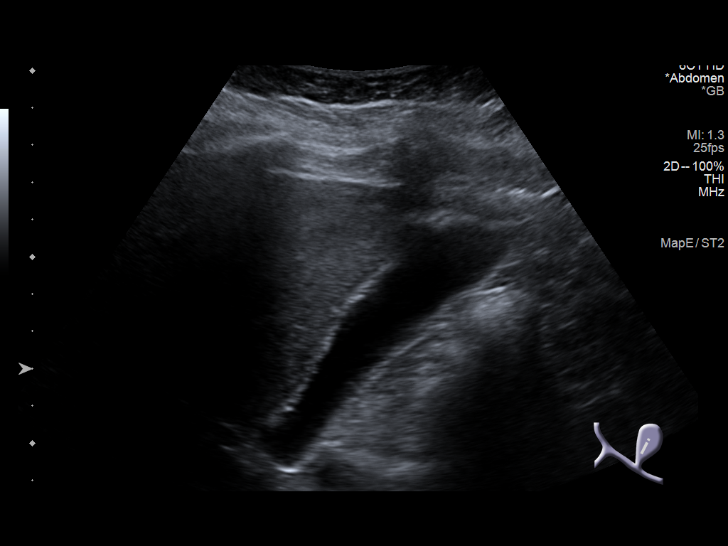
[im 8/48]
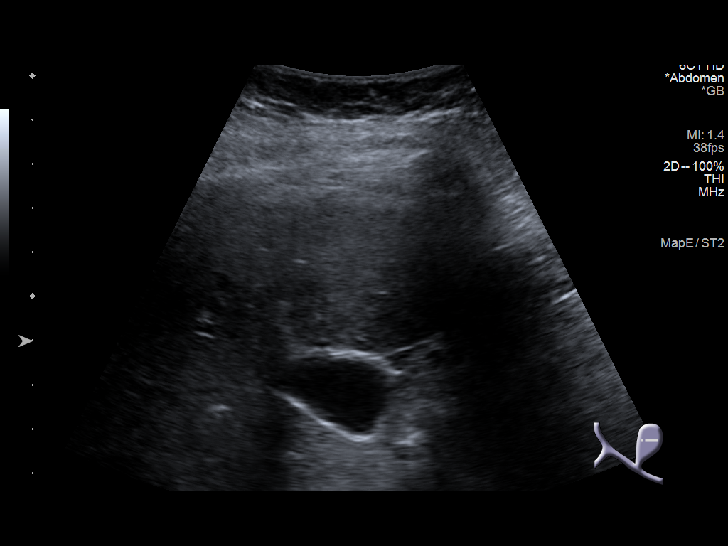
[im 12/48]
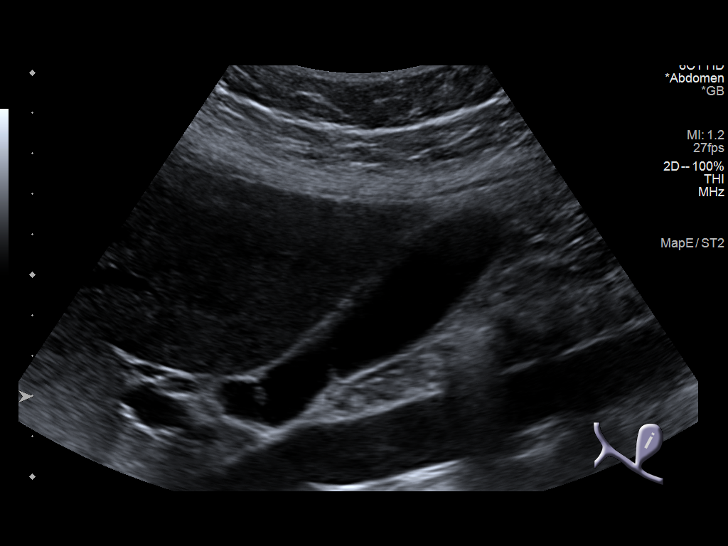
[im 16/48]
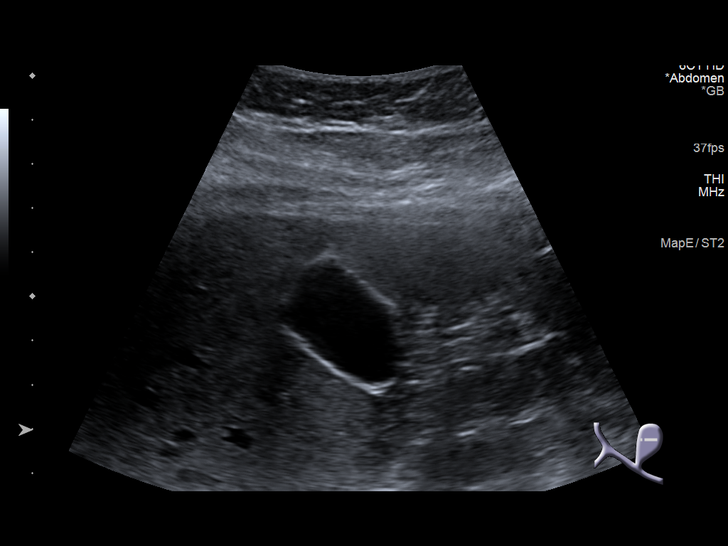
[im 18/48]
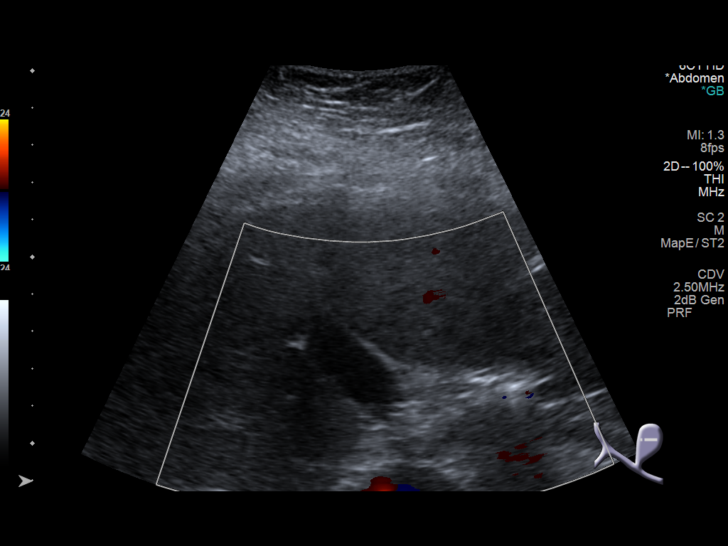
[im 22/48]
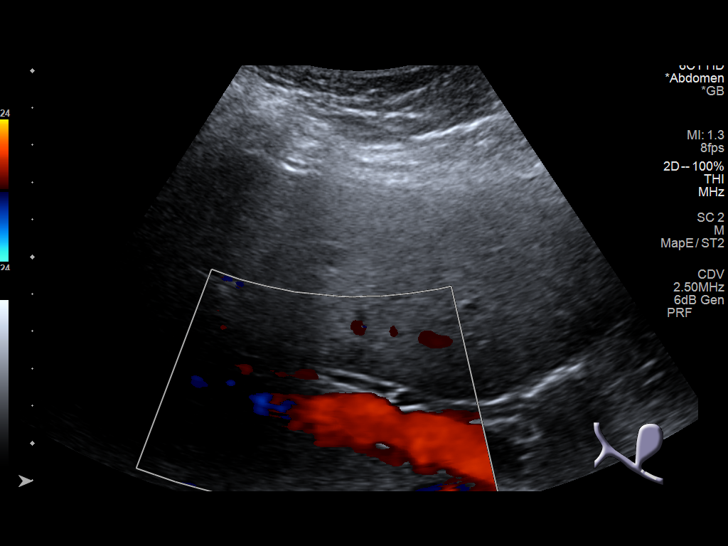
[im 26/48]
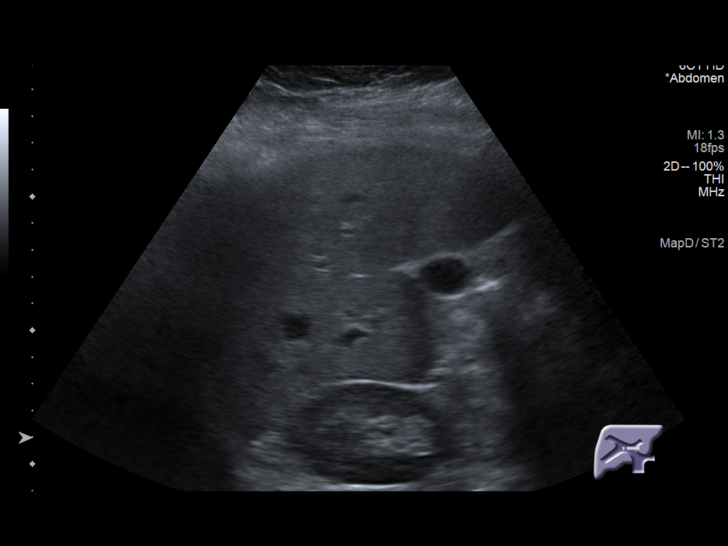
[im 30/48]
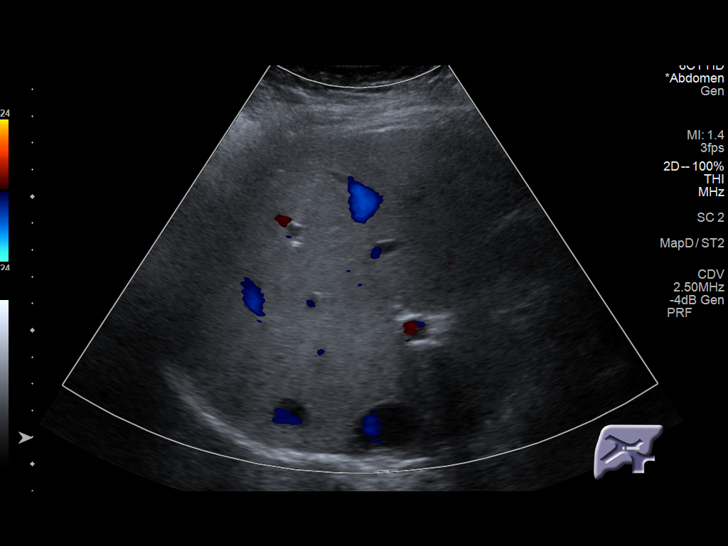
[im 32/48]
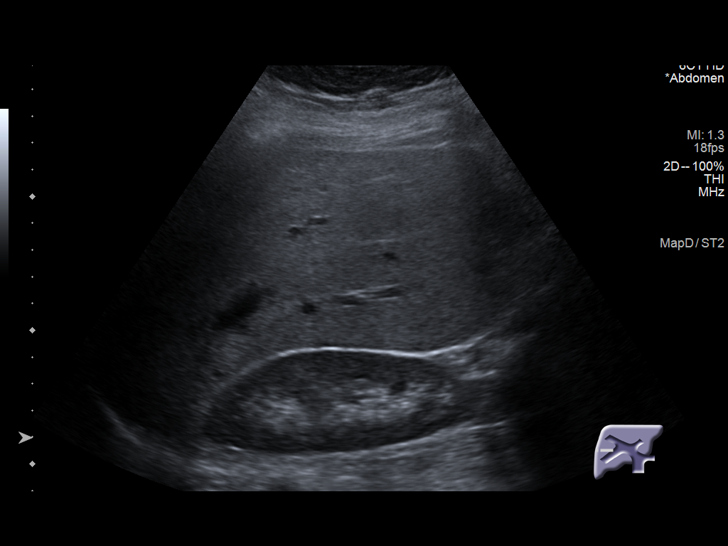
[im 36/48]
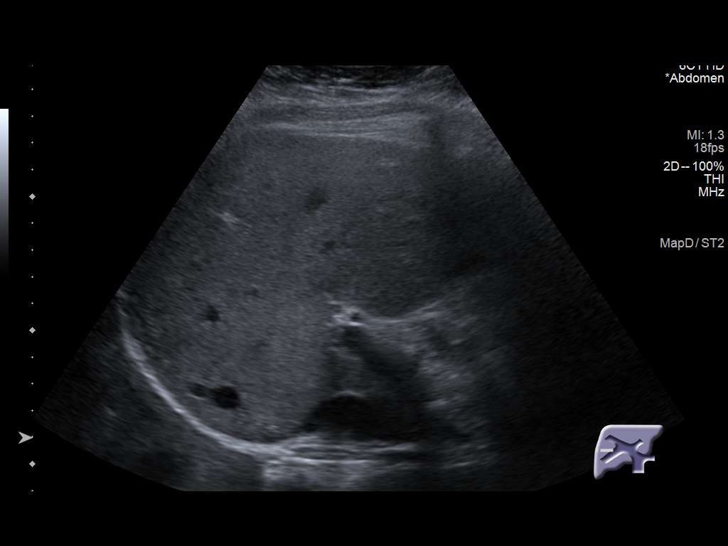
[im 40/48]
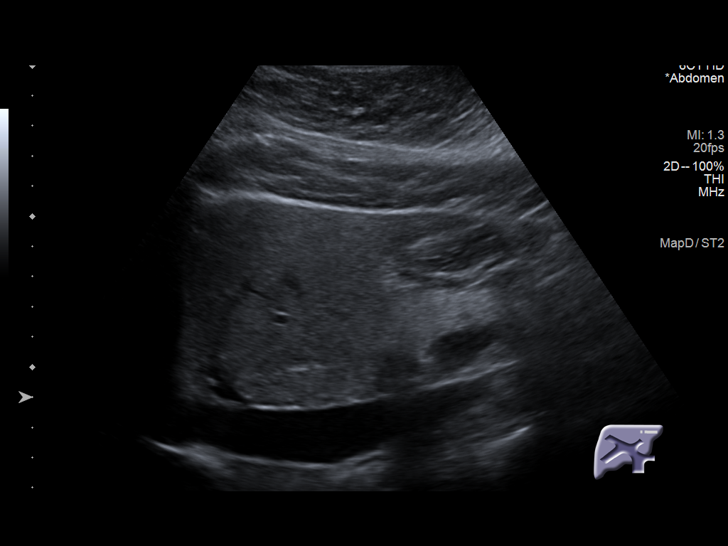
[im 44/48]
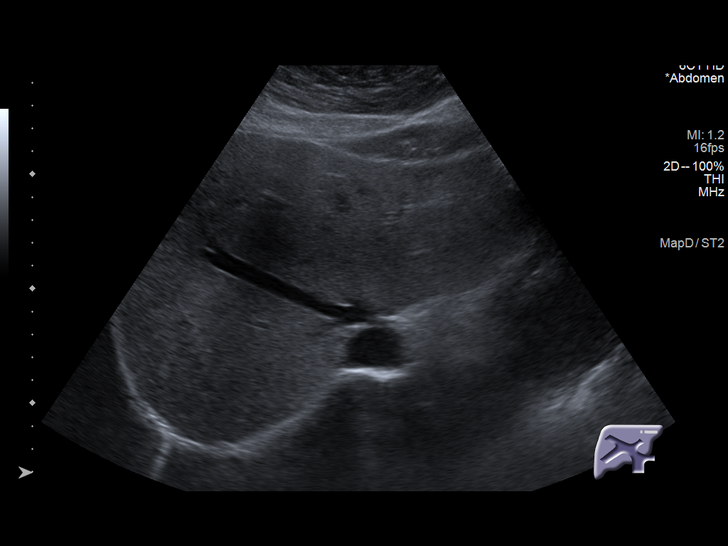
[im 48/48]
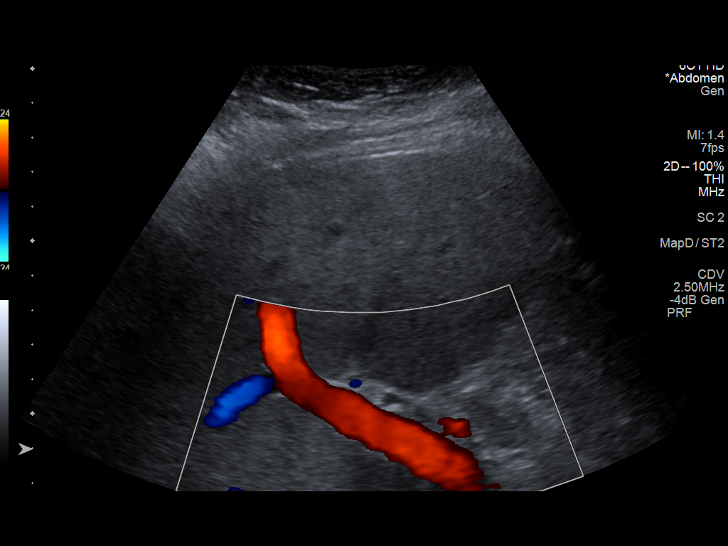

[14 of 25 positions shown; findings below may reference images not displayed]

FINDINGS: Gallbladder:

No gallstones or wall thickening visualized. No sonographic Murphy
sign noted by sonographer.

Common bile duct:

Diameter: 3.7 mm

Liver:

No focal lesion identified. Increased hepatic parenchymal
echogenicity. Portal vein is patent on color Doppler imaging with
normal direction of blood flow towards the liver.
IMPRESSION: 1. No cholelithiasis or sonographic evidence of acute cholecystitis.
2. Increased hepatic echogenicity as can be seen with hepatic
steatosis.

## 2021-05-23 ENCOUNTER — Ambulatory Visit: Payer: No Typology Code available for payment source | Admitting: Internal Medicine

## 2021-07-02 ENCOUNTER — Other Ambulatory Visit (HOSPITAL_COMMUNITY): Payer: Self-pay

## 2021-07-02 ENCOUNTER — Other Ambulatory Visit: Payer: Self-pay | Admitting: Family Medicine

## 2021-07-02 MED ORDER — PRAMIPEXOLE DIHYDROCHLORIDE 1 MG PO TABS
ORAL_TABLET | Freq: Every day | ORAL | 0 refills | Status: DC
  Filled 2021-07-02: qty 90, 90d supply, fill #0

## 2021-07-02 MED ORDER — SERTRALINE HCL 50 MG PO TABS
ORAL_TABLET | Freq: Every day | ORAL | 0 refills | Status: DC
  Filled 2021-07-02: qty 135, 90d supply, fill #0

## 2021-07-02 NOTE — Telephone Encounter (Signed)
Patient informed that medication was sent in and to follow up in the spring. Verbalized understanding

## 2021-07-02 NOTE — Telephone Encounter (Signed)
90-day on each medicine needs to do a follow-up in the spring

## 2021-07-03 ENCOUNTER — Other Ambulatory Visit (HOSPITAL_COMMUNITY): Payer: Self-pay

## 2021-07-11 ENCOUNTER — Encounter: Payer: Self-pay | Admitting: Internal Medicine

## 2021-07-11 ENCOUNTER — Telehealth: Payer: Self-pay | Admitting: Internal Medicine

## 2021-07-11 ENCOUNTER — Other Ambulatory Visit: Payer: Self-pay

## 2021-07-11 ENCOUNTER — Other Ambulatory Visit (HOSPITAL_COMMUNITY): Payer: Self-pay

## 2021-07-11 ENCOUNTER — Encounter: Payer: Self-pay | Admitting: *Deleted

## 2021-07-11 ENCOUNTER — Other Ambulatory Visit: Payer: Self-pay | Admitting: *Deleted

## 2021-07-11 ENCOUNTER — Ambulatory Visit: Payer: No Typology Code available for payment source | Admitting: Internal Medicine

## 2021-07-11 VITALS — BP 130/82 | HR 79 | Temp 97.3°F | Ht 64.0 in | Wt 299.2 lb

## 2021-07-11 DIAGNOSIS — K219 Gastro-esophageal reflux disease without esophagitis: Secondary | ICD-10-CM | POA: Diagnosis not present

## 2021-07-11 DIAGNOSIS — R197 Diarrhea, unspecified: Secondary | ICD-10-CM | POA: Diagnosis not present

## 2021-07-11 DIAGNOSIS — R110 Nausea: Secondary | ICD-10-CM

## 2021-07-11 DIAGNOSIS — R1013 Epigastric pain: Secondary | ICD-10-CM | POA: Diagnosis not present

## 2021-07-11 DIAGNOSIS — G8929 Other chronic pain: Secondary | ICD-10-CM

## 2021-07-11 MED ORDER — CLENPIQ 10-3.5-12 MG-GM -GM/160ML PO SOLN
1.0000 | Freq: Once | ORAL | 0 refills | Status: AC
  Filled 2021-07-11 (×2): qty 320, 1d supply, fill #0

## 2021-07-11 NOTE — Telephone Encounter (Signed)
Patient wants to know if you can call in prep a pic for her instead of the other prep that was talked about

## 2021-07-11 NOTE — H&P (View-Only) (Signed)
Primary Care Physician:  [name], MD Primary Gastroenterologist:  Dr. Abbey Chatters  Chief Complaint  Patient presents with   Diarrhea    Comes and goes   Abdominal Pain    epigastric   burping    HPI:   Elizabeth Foster is a very pleasant 36 y.o. female who presents to the clinic today by referral from her PCP Dr. Wolfgang Phoenix.  She has numerous GI complaints for me today.  She states for the last 2 years she has had chronic diarrhea.  This has flipped back to constipation at times but she seems to be more on the loose side.  Has tried Imodium.  Has tried dietary changes without improvement in her symptoms.  No melena hematochezia.  Celiac and thyroid testing WNL.  No previous colonoscopy.  Denies any family history of colorectal malignancy.  Also with chronic acid reflux which has recently worsened.  Notes sour taste in her mouth as well as intermittent nausea.  No overt dysphagia or odynophagia.  Previously on pantoprazole, now on omeprazole 20 mg daily with breakthrough symptoms.  Also having epigastric pain which is new for her.  Ultrasound 11/20/2018 unremarkable besides hepatic steatosis.  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  Past Medical History:  Diagnosis Date   Allergy    Anxiety    Back pain    Chronic kidney disease    chronic uti and kidney infections   GERD (gastroesophageal reflux disease)    Hypertension during pregnancy    Knee pain    Lower extremity edema    Obesity    RLS (restless legs syndrome)    Varicose vein of leg     Past Surgical History:  Procedure Laterality Date   ADENOIDECTOMY     CESAREAN SECTION WITH BILATERAL TUBAL LIGATION N/A 04/05/2017   Procedure: CESAREAN SECTION WITH BILATERAL TUBAL LIGATION;  Surgeon: Marylynn Pearson, MD;  Location: [facility];  Service: Obstetrics;  Laterality: N/A;   KNEE ARTHROSCOPY     x2   MIDDLE EAR SURGERY     benign growth removed from eardrum    TONSILLECTOMY      Current Outpatient Medications  Medication Sig Dispense Refill   acetaminophen (TYLENOL) 325 MG tablet Take 650 mg every 6 (six) hours as needed by mouth for moderate pain.     Biotin 10000 MCG TABS Take 1 tablet by mouth daily. Hair, skin, nails     Cranberry 1000 MG CAPS Take by mouth daily.     fexofenadine (ALLEGRA ODT) 30 MG disintegrating tablet Take 30 mg by mouth daily.     fluticasone (FLONASE) 50 MCG/ACT nasal spray Place into both nostrils as needed.     meclizine (ANTIVERT) 25 MG tablet Take 1 tablet (25 mg total) by mouth 3 (three) times daily as needed for dizziness. 30 tablet 0   Multiple Vitamin (MULTIVITAMIN) capsule Take 1 capsule by mouth daily.     Omega-3 Fatty Acids (FISH OIL) 1000 MG CAPS Take by mouth daily.     omeprazole (PRILOSEC) 20 MG capsule Take 20 mg by mouth daily.     pramipexole (MIRAPEX) 1 MG tablet TAKE 1 TABLET BY MOUTH AT BEDTIME 90 tablet 0   sertraline (ZOLOFT) 50 MG tablet TAKE 1 AND 1/2 TABLETS BY MOUTH DAILY 135 tablet 0   VITAMIN D PO Take by mouth. 2,000 iu daily     Insulin Pen Needle (UNIFINE PENTIPS) 32G X 4  MM MISC Use as directed with saxenda daily (Patient not taking: Reported on 07/11/2021) 100 each 0   Liraglutide -Weight Management (SAXENDA) 18 MG/3ML SOPN Inject 0.6 mg into the skin daily for a week, week two inject 1.2 mg daily , week three 1.8 mg daily , week four 2.4 mg daily, until reaches 3 mg as tolerated (Patient not taking: Reported on 07/11/2021) 15 mL 1   No current facility-administered medications for this visit.    Allergies as of 07/11/2021 - Review Complete 07/11/2021  Allergen Reaction Noted   Colace [docusate calcium] Other (See Comments) 01/30/2014   Polytrim [polymyxin b-trimethoprim]  10/15/2017   Amoxicillin Rash 03/23/2011    Family History  Problem Relation Age of Onset   Anemia Mother    Thyroid disease Mother    Diabetes Mother    Sleep apnea Mother    Depression Mother    Depression  Father    COPD Maternal Grandmother    Thyroid disease Maternal Grandmother    COPD Maternal Grandfather    Heart disease Paternal Grandfather    Diabetes Paternal Grandfather    Anesthesia problems Neg Hx    Hypotension Neg Hx    Malignant hyperthermia Neg Hx    Pseudochol deficiency Neg Hx     Social History   Socioeconomic History   Marital status: Married    Spouse name: Not on file   Number of children: 3   Years of education: Not on file   Highest education level: Not on file  Occupational History   Occupation: Therapist, 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  Tobacco Use   Smoking status: Never   Smokeless tobacco: Never  Vaping Use   Vaping Use: Never used  Substance and Sexual Activity   Alcohol use: No   Drug use: No   Sexual activity: Yes    Birth control/protection: None  Other Topics Concern   Not on file  Social History Narrative   Not on file   Social Determinants of Health   Financial Resource Strain: Not on file  Food Insecurity: Not on file  Transportation Needs: Not on file  Physical Activity: Not on file  Stress: Not on file  Social Connections: Not on file  Intimate Partner Violence: Not on file    Subjective: Review of Systems  Constitutional:  Negative for chills and fever.  HENT:  Negative for congestion and hearing loss.   Eyes:  Negative for blurred vision and double vision.  Respiratory:  Negative for cough and shortness of breath.   Cardiovascular:  Negative for chest pain and palpitations.  Gastrointestinal:  Positive for abdominal pain, diarrhea and heartburn. Negative for blood in stool, constipation, melena and vomiting.  Genitourinary:  Negative for dysuria and urgency.  Musculoskeletal:  Negative for joint pain and myalgias.  Skin:  Negative for itching and rash.  Neurological:  Negative for dizziness and headaches.  Psychiatric/Behavioral:  Negative for depression. The patient is not nervous/anxious.       Objective: BP 130/82    Pulse 79     Temp (!) 97.3 F (36.3 C) (Temporal)    Ht 5\' 4"  (1.626 m)    Wt 299 lb 3.2 oz (135.7 kg)    LMP 07/04/2021 (Approximate)    BMI 51.36 kg/m  Physical Exam Constitutional:      Appearance: Normal appearance. She is obese.  HENT:     Head: Normocephalic and atraumatic.  Eyes:     Extraocular Movements: Extraocular movements intact.  Conjunctiva/sclera: Conjunctivae normal.  Cardiovascular:     Rate and Rhythm: Normal rate and regular rhythm.  Pulmonary:     Effort: Pulmonary effort is normal.     Breath sounds: Normal breath sounds.  Abdominal:     General: Bowel sounds are normal.     Palpations: Abdomen is soft.  Musculoskeletal:        General: No swelling. Normal range of motion.     Cervical back: Normal range of motion and neck supple.  Skin:    General: Skin is warm and dry.     Coloration: Skin is not jaundiced.  Neurological:     General: No focal deficit present.     Mental Status: She is alert and oriented to person, place, and time.  Psychiatric:        Mood and Affect: Mood normal.        Behavior: Behavior normal.     Assessment: *Diarrhea and constipation-chronic  *GERD-chronic, not well controlled *Epigastric pain *Nausea  Plan: Will schedule for EGD to evaluate for peptic ulcer disease, esophagitis, gastritis, H. Pylori, duodenitis, or other. Will also evaluate for esophageal stricture, Schatzki's ring, esophageal web or other.   At the same time, will perform colonoscopy with random colon biopsies to further evaluate her chronic diarrhea.  The risks including infection, bleed, or perforation as well as benefits, limitations, alternatives and imponderables have been reviewed with the patient. Potential for esophageal dilation, biopsy, etc. have also been reviewed.  Questions have been answered. All parties agreeable.  Continue on omeprazole 20 mg daily.  May need to make adjustments pending endoscopic evaluation.  Consider ultrasound and/or HIDA scan  to rule out biliary colic as cause of her worsening epigastric pain and change in bowel habits pending endoscopic evaluation.  Thank you Dr. Wolfgang Phoenix for the kind referral.  07/11/2021 10:19 AM   Disclaimer: This note was dictated with voice recognition software. Similar sounding words can inadvertently be transcribed and may not be corrected upon review.

## 2021-07-11 NOTE — Progress Notes (Signed)
Primary Care Physician:  Elizabeth Drown, MD Primary Gastroenterologist:  Dr. Abbey Foster  Chief Complaint  Patient presents with   Diarrhea    Comes and goes   Abdominal Pain    epigastric   burping    HPI:   Elizabeth Foster is a very pleasant 36 y.o. female who presents to the clinic today by referral from her PCP Dr. Wolfgang Foster.  She has numerous GI complaints for me today.  She states for the last 2 years she has had chronic diarrhea.  This has flipped back to constipation at times but she seems to be more on the loose side.  Has tried Imodium.  Has tried dietary changes without improvement in her symptoms.  No melena hematochezia.  Celiac and thyroid testing WNL.  No previous colonoscopy.  Denies any family history of colorectal malignancy.  Also with chronic acid reflux which has recently worsened.  Notes sour taste in her mouth as well as intermittent nausea.  No overt dysphagia or odynophagia.  Previously on pantoprazole, now on omeprazole 20 mg daily with breakthrough symptoms.  Also having epigastric pain which is new for her.  Ultrasound 11/20/2018 unremarkable besides hepatic steatosis.  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  Past Medical History:  Diagnosis Date   Allergy    Anxiety    Back pain    Chronic kidney disease    chronic uti and kidney infections   GERD (gastroesophageal reflux disease)    Hypertension during pregnancy    Knee pain    Lower extremity edema    Obesity    RLS (restless legs syndrome)    Varicose vein of leg     Past Surgical History:  Procedure Laterality Date   ADENOIDECTOMY     CESAREAN SECTION WITH BILATERAL TUBAL LIGATION N/A 04/05/2017   Procedure: CESAREAN SECTION WITH BILATERAL TUBAL LIGATION;  Surgeon: Marylynn Pearson, MD;  Location: [place];  Service: Obstetrics;  Laterality: N/A;   KNEE ARTHROSCOPY     x2   MIDDLE EAR SURGERY     benign growth removed from eardrum    TONSILLECTOMY      Current Outpatient Medications  Medication Sig Dispense Refill   acetaminophen (TYLENOL) 325 MG tablet Take 650 mg every 6 (six) hours as needed by mouth for moderate pain.     Biotin 10000 MCG TABS Take 1 tablet by mouth daily. Hair, skin, nails     Cranberry 1000 MG CAPS Take by mouth daily.     fexofenadine (ALLEGRA ODT) 30 MG disintegrating tablet Take 30 mg by mouth daily.     fluticasone (FLONASE) 50 MCG/ACT nasal spray Place into both nostrils as needed.     meclizine (ANTIVERT) 25 MG tablet Take 1 tablet (25 mg total) by mouth 3 (three) times daily as needed for dizziness. 30 tablet 0   Multiple Vitamin (MULTIVITAMIN) capsule Take 1 capsule by mouth daily.     Omega-3 Fatty Acids (FISH OIL) 1000 MG CAPS Take by mouth daily.     omeprazole (PRILOSEC) 20 MG capsule Take 20 mg by mouth daily.     pramipexole (MIRAPEX) 1 MG tablet TAKE 1 TABLET BY MOUTH AT BEDTIME 90 tablet 0   sertraline (ZOLOFT) 50 MG tablet TAKE 1 AND 1/2 TABLETS BY MOUTH DAILY 135 tablet 0   VITAMIN D PO Take by mouth. 2,000 iu daily     Insulin Pen Needle (UNIFINE PENTIPS) 32G X 4  MM MISC Use as directed with saxenda daily (Patient not taking: Reported on 07/11/2021) 100 each 0   Liraglutide -Weight Management (SAXENDA) 18 MG/3ML SOPN Inject 0.6 mg into the skin daily for a week, week two inject 1.2 mg daily , week three 1.8 mg daily , week four 2.4 mg daily, until reaches 3 mg as tolerated (Patient not taking: Reported on 07/11/2021) 15 mL 1   No current facility-administered medications for this visit.    Allergies as of 07/11/2021 - Review Complete 07/11/2021  Allergen Reaction Noted   Colace [docusate calcium] Other (See Comments) 01/30/2014   Polytrim [polymyxin b-trimethoprim]  10/15/2017   Amoxicillin Rash 03/23/2011    Family History  Problem Relation Age of Onset   Anemia Mother    Thyroid disease Mother    Diabetes Mother    Sleep apnea Mother    Depression Mother    Depression  Father    COPD Maternal Grandmother    Thyroid disease Maternal Grandmother    COPD Maternal Grandfather    Heart disease Paternal Grandfather    Diabetes Paternal Grandfather    Anesthesia problems Neg Hx    Hypotension Neg Hx    Malignant hyperthermia Neg Hx    Pseudochol deficiency Neg Hx     Social History   Socioeconomic History   Marital status: Married    Spouse name: Not on file   Number of children: 3   Years of education: Not on file   Highest education level: Not on file  Occupational History   Occupation: Therapist, 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  Tobacco Use   Smoking status: Never   Smokeless tobacco: Never  Vaping Use   Vaping Use: Never used  Substance and Sexual Activity   Alcohol use: No   Drug use: No   Sexual activity: Yes    Birth control/protection: None  Other Topics Concern   Not on file  Social History Narrative   Not on file   Social Determinants of Health   Financial Resource Strain: Not on file  Food Insecurity: Not on file  Transportation Needs: Not on file  Physical Activity: Not on file  Stress: Not on file  Social Connections: Not on file  Intimate Partner Violence: Not on file    Subjective: Review of Systems  Constitutional:  Negative for chills and fever.  HENT:  Negative for congestion and hearing loss.   Eyes:  Negative for blurred vision and double vision.  Respiratory:  Negative for cough and shortness of breath.   Cardiovascular:  Negative for chest pain and palpitations.  Gastrointestinal:  Positive for abdominal pain, diarrhea and heartburn. Negative for blood in stool, constipation, melena and vomiting.  Genitourinary:  Negative for dysuria and urgency.  Musculoskeletal:  Negative for joint pain and myalgias.  Skin:  Negative for itching and rash.  Neurological:  Negative for dizziness and headaches.  Psychiatric/Behavioral:  Negative for depression. The patient is not nervous/anxious.       Objective: BP 130/82    Pulse 79     Temp (!) 97.3 F (36.3 C) (Temporal)    Ht 5\' 4"  (1.626 m)    Wt 299 lb 3.2 oz (135.7 kg)    LMP 07/04/2021 (Approximate)    BMI 51.36 kg/m  Physical Exam Constitutional:      Appearance: Normal appearance. She is obese.  HENT:     Head: Normocephalic and atraumatic.  Eyes:     Extraocular Movements: Extraocular movements intact.  Conjunctiva/sclera: Conjunctivae normal.  Cardiovascular:     Rate and Rhythm: Normal rate and regular rhythm.  Pulmonary:     Effort: Pulmonary effort is normal.     Breath sounds: Normal breath sounds.  Abdominal:     General: Bowel sounds are normal.     Palpations: Abdomen is soft.  Musculoskeletal:        General: No swelling. Normal range of motion.     Cervical back: Normal range of motion and neck supple.  Skin:    General: Skin is warm and dry.     Coloration: Skin is not jaundiced.  Neurological:     General: No focal deficit present.     Mental Status: She is alert and oriented to person, place, and time.  Psychiatric:        Mood and Affect: Mood normal.        Behavior: Behavior normal.     Assessment: *Diarrhea and constipation-chronic  *GERD-chronic, not well controlled *Epigastric pain *Nausea  Plan: Will schedule for EGD to evaluate for peptic ulcer disease, esophagitis, gastritis, H. Pylori, duodenitis, or other. Will also evaluate for esophageal stricture, Schatzki's ring, esophageal web or other.   At the same time, will perform colonoscopy with random colon biopsies to further evaluate her chronic diarrhea.  The risks including infection, bleed, or perforation as well as benefits, limitations, alternatives and imponderables have been reviewed with the patient. Potential for esophageal dilation, biopsy, etc. have also been reviewed.  Questions have been answered. All parties agreeable.  Continue on omeprazole 20 mg daily.  May need to make adjustments pending endoscopic evaluation.  Consider ultrasound and/or HIDA scan  to rule out biliary colic as cause of her worsening epigastric pain and change in bowel habits pending endoscopic evaluation.  Thank you Dr. Wolfgang Foster for the kind referral.  07/11/2021 10:19 AM   Disclaimer: This note was dictated with voice recognition software. Similar sounding words can inadvertently be transcribed and may not be corrected upon review.

## 2021-07-11 NOTE — Telephone Encounter (Signed)
Called pt and made aware sent clenpiq to pharmacy. She voiced understanding

## 2021-07-11 NOTE — Patient Instructions (Signed)
We will schedule you for upper endoscopy to further evaluate your epigastric pain, acid reflux, intermittent nausea.  At the same time we will perform colonoscopy to evaluate your chronic diarrhea/change in bowel habits.  Continue on omeprazole 20 mg daily.  We may need to make adjustments of this pending endoscopic evaluation.  We may need to further evaluate your gallbladder with ultrasound and/or HIDA scan pending endoscopic evaluation.  It was very nice meeting you today.  Dr. Abbey Chatters  Lifestyle and home remedies TO MANAGE REFLUX/HEARTBURN    You may eliminate or reduce the frequency of heartburn by making the following lifestyle changes:   Control your weight. Being overweight is a major risk factor for heartburn and GERD. Excess pounds put pressure on your abdomen, pushing up your stomach and causing acid to back up into your esophagus.    Eat smaller meals. 4 TO 6 MEALS A DAY. This reduces pressure on the lower esophageal sphincter, helping to prevent the valve from opening and acid from washing back into your esophagus.     Loosen your belt. Clothes that fit tightly around your waist put pressure on your abdomen and the lower esophageal sphincter.     Eliminate heartburn triggers. Everyone has specific triggers. Common triggers such as fatty or fried foods, spicy food, tomato sauce, carbonated beverages, alcohol, chocolate, mint, garlic, onion, caffeine and nicotine may make heartburn worse.    Avoid stooping or bending. Tying your shoes is OK. Bending over for longer periods to weed your garden isn't, especially soon after eating.    Don't lie down after a meal. Wait at least three to four hours after eating before going to bed, and don't lie down right after eating.    At [facility] we value your feedback. You may receive a survey about your visit today. Please share your experience as we strive to create trusting relationships with our patients to provide  genuine, compassionate, quality care.  We appreciate your understanding and patience as we review any laboratory studies, imaging, and other diagnostic tests that are ordered as we care for you. Our office policy is 5 business days for review of these results, and any emergent or urgent results are addressed in a timely manner for your best interest. If you do not hear from our office in 1 week, please contact us.   We also encourage the use of MyChart, which contains your medical information for your review as well. If you are not enrolled in this feature, an access code is on this after visit summary for your convenience. Thank you for allowing [place] to be involved in your care.  It was great to see you today!  I hope you have a great rest of your Winter!    Elon Alas. Abbey Chatters, D.O. Gastroenterology and Hepatology [facility]

## 2021-07-16 ENCOUNTER — Encounter: Payer: Self-pay | Admitting: *Deleted

## 2021-07-16 ENCOUNTER — Telehealth: Payer: Self-pay | Admitting: *Deleted

## 2021-07-16 NOTE — Telephone Encounter (Signed)
Spoke with Jodelle Green. Was advised PA approved. Auth# [id number], DOS: 07/26/2021-08/22/2021 Call ref# [id number]

## 2021-07-16 NOTE — Telephone Encounter (Signed)
Called [facility]. Was advised PA is required and had to fax clinicals. Call ref# [id number]. Fax# [phone]

## 2021-07-24 ENCOUNTER — Encounter (HOSPITAL_COMMUNITY): Payer: No Typology Code available for payment source

## 2021-07-24 NOTE — Patient Instructions (Signed)
Elizabeth Foster  07/24/2021     @PREFPERIOPPHARMACY @   Your procedure is scheduled on  07/26/2021.   Report to [facility] at  1015  A.M.   Call this number if you have problems the morning of surgery:  [phone]   Remember:  Follow the diet and prep instructions given to you by the office.    Take these medicines the morning of surgery with A SIP OF WATER                       prilosec, zoloft, antivert(if needed).     Do not wear jewelry, make-up or nail polish.  Do not wear lotions, powders, or perfumes, or deodorant.  Do not shave 48 hours prior to surgery.  Men may shave face and neck.  Do not bring valuables to the hospital.  [facility] is not responsible for any belongings or valuables.  Contacts, dentures or bridgework may not be worn into surgery.  Leave your suitcase in the car.  After surgery it may be brought to your room.  For patients admitted to the hospital, discharge time will be determined by your treatment team.  Patients discharged the day of surgery will not be allowed to drive home and must have someone with them for 24 hours.    Special instructions:  DO NOT smoke tobacco or vape for 24 hours before your procedure.  Please read over the following fact sheets that you were given. Anesthesia Post-op Instructions and Care and Recovery After Surgery      Upper Endoscopy, Adult, Care After This sheet gives you information about how to care for yourself after your procedure. Your health care provider may also give you more specific instructions. If you have problems or questions, contact your health care provider. What can I expect after the procedure? After the procedure, it is common to have: A sore throat. Mild stomach pain or discomfort. Bloating. Nausea. Follow these instructions at home:  Follow instructions from your health care provider about what to eat or drink after your procedure. Return to your normal activities as  told by your health care provider. Ask your health care provider what activities are safe for you. Take over-the-counter and prescription medicines only as told by your health care provider. If you were given a sedative during the procedure, it can affect you for several hours. Do not drive or operate machinery until your health care provider says that it is safe. Keep all follow-up visits as told by your health care provider. This is important. Contact a health care provider if you have: A sore throat that lasts longer than one day. Trouble swallowing. Get help right away if: You vomit blood or your vomit looks like coffee grounds. You have: A fever. Bloody, black, or tarry stools. A severe sore throat or you cannot swallow. Difficulty breathing. Severe pain in your chest or abdomen. Summary After the procedure, it is common to have a sore throat, mild stomach discomfort, bloating, and nausea. If you were given a sedative during the procedure, it can affect you for several hours. Do not drive or operate machinery until your health care provider says that it is safe. Follow instructions from your health care provider about what to eat or drink after your procedure. Return to your normal activities as told by your health care provider. This information is not intended to replace advice given to you by your  health care provider. Make sure you discuss any questions you have with your health care provider. Document Revised: 03/26/2019 Document Reviewed: 10/20/2017 Elsevier Patient Education  2022 [place]. Colonoscopy, Adult, Care After This sheet gives you information about how to care for yourself after your procedure. Your health care provider may also give you more specific instructions. If you have problems or questions, contact your health care provider. What can I expect after the procedure? After the procedure, it is common to have: A small amount of blood in your stool for 24  hours after the procedure. Some gas. Mild cramping or bloating of your abdomen. Follow these instructions at home: Eating and drinking  Drink enough fluid to keep your urine pale yellow. Follow instructions from your health care provider about eating or drinking restrictions. Resume your normal diet as instructed by your health care provider. Avoid heavy or fried foods that are hard to digest. Activity Rest as told by your health care provider. Avoid sitting for a long time without moving. Get up to take short walks every 1-2 hours. This is important to improve blood flow and breathing. Ask for help if you feel weak or unsteady. Return to your normal activities as told by your health care provider. Ask your health care provider what activities are safe for you. Managing cramping and bloating  Try walking around when you have cramps or feel bloated. Apply heat to your abdomen as told by your health care provider. Use the heat source that your health care provider recommends, such as a moist heat pack or a heating pad. Place a towel between your skin and the heat source. Leave the heat on for 20-30 minutes. Remove the heat if your skin turns bright red. This is especially important if you are unable to feel pain, heat, or cold. You may have a greater risk of getting burned. General instructions If you were given a sedative during the procedure, it can affect you for several hours. Do not drive or operate machinery until your health care provider says that it is safe. For the first 24 hours after the procedure: Do not sign important documents. Do not drink alcohol. Do your regular daily activities at a slower pace than normal. Eat soft foods that are easy to digest. Take over-the-counter and prescription medicines only as told by your health care provider. Keep all follow-up visits as told by your health care provider. This is important. Contact a health care provider if: You have blood in  your stool 2-3 days after the procedure. Get help right away if you have: More than a small spotting of blood in your stool. Large blood clots in your stool. Swelling of your abdomen. Nausea or vomiting. A fever. Increasing pain in your abdomen that is not relieved with medicine. Summary After the procedure, it is common to have a small amount of blood in your stool. You may also have mild cramping and bloating of your abdomen. If you were given a sedative during the procedure, it can affect you for several hours. Do not drive or operate machinery until your health care provider says that it is safe. Get help right away if you have a lot of blood in your stool, nausea or vomiting, a fever, or increased pain in your abdomen. This information is not intended to replace advice given to you by your health care provider. Make sure you discuss any questions you have with your health care provider. Document Revised: 03/26/2019 Document Reviewed: 12/14/2018  Elsevier Patient Education  2022 [place] After This sheet gives you information about how to care for yourself after your procedure. Your health care provider may also give you more specific instructions. If you have problems or questions, contact your health care provider. What can I expect after the procedure? After the procedure, it is common to have: Tiredness. Forgetfulness about what happened after the procedure. Impaired judgment for important decisions. Nausea or vomiting. Some difficulty with balance. Follow these instructions at home: For the time period you were told by your health care provider:   Rest as needed. Do not participate in activities where you could fall or become injured. Do not drive or use machinery. Do not drink alcohol. Do not take sleeping pills or medicines that cause drowsiness. Do not make important decisions or sign legal documents. Do not take care of children on  your own. Eating and drinking Follow the diet that is recommended by your health care provider. Drink enough fluid to keep your urine pale yellow. If you vomit: Drink water, juice, or soup when you can drink without vomiting. Make sure you have little or no nausea before eating solid foods. General instructions Have a responsible adult stay with you for the time you are told. It is important to have someone help care for you until you are awake and alert. Take over-the-counter and prescription medicines only as told by your health care provider. If you have sleep apnea, surgery and certain medicines can increase your risk for breathing problems. Follow instructions from your health care provider about wearing your sleep device: Anytime you are sleeping, including during daytime naps. While taking prescription pain medicines, sleeping medicines, or medicines that make you drowsy. Avoid smoking. Keep all follow-up visits as told by your health care provider. This is important. Contact a health care provider if: You keep feeling nauseous or you keep vomiting. You feel light-headed. You are still sleepy or having trouble with balance after 24 hours. You develop a rash. You have a fever. You have redness or swelling around the IV site. Get help right away if: You have trouble breathing. You have new-onset confusion at home. Summary For several hours after your procedure, you may feel tired. You may also be forgetful and have poor judgment. Have a responsible adult stay with you for the time you are told. It is important to have someone help care for you until you are awake and alert. Rest as told. Do not drive or operate machinery. Do not drink alcohol or take sleeping pills. Get help right away if you have trouble breathing, or if you suddenly become confused. This information is not intended to replace advice given to you by your health care provider. Make sure you discuss any questions  you have with your health care provider. Document Revised: 02/03/2020 Document Reviewed: 04/22/2019 Elsevier Patient Education  2022 [employer].

## 2021-07-25 ENCOUNTER — Encounter (HOSPITAL_COMMUNITY): Payer: Self-pay

## 2021-07-25 ENCOUNTER — Other Ambulatory Visit: Payer: Self-pay

## 2021-07-25 ENCOUNTER — Encounter (HOSPITAL_COMMUNITY)
Admission: RE | Admit: 2021-07-25 | Discharge: 2021-07-25 | Disposition: A | Discharge status: Home / Self Care | Payer: No Typology Code available for payment source | Source: Ambulatory Visit | Attending: Internal Medicine | Admitting: Internal Medicine

## 2021-07-25 VITALS — BP 139/78 | HR 82 | Temp 97.3°F | Resp 18 | Ht 64.0 in | Wt 299.0 lb

## 2021-07-25 DIAGNOSIS — Z01818 Encounter for other preprocedural examination: Secondary | ICD-10-CM

## 2021-07-25 DIAGNOSIS — Z01812 Encounter for preprocedural laboratory examination: Secondary | ICD-10-CM | POA: Insufficient documentation

## 2021-07-25 LAB — PREGNANCY, URINE: Preg Test, Ur: NEGATIVE

## 2021-07-26 ENCOUNTER — Ambulatory Visit (HOSPITAL_COMMUNITY)
Admission: RE | Admit: 2021-07-26 | Discharge: 2021-07-26 | Disposition: A | Discharge status: Home / Self Care | Payer: No Typology Code available for payment source | Attending: Internal Medicine | Admitting: Internal Medicine

## 2021-07-26 ENCOUNTER — Ambulatory Visit (HOSPITAL_COMMUNITY): Payer: No Typology Code available for payment source | Admitting: Anesthesiology

## 2021-07-26 ENCOUNTER — Other Ambulatory Visit (HOSPITAL_COMMUNITY): Payer: Self-pay

## 2021-07-26 ENCOUNTER — Other Ambulatory Visit: Payer: Self-pay

## 2021-07-26 ENCOUNTER — Encounter (HOSPITAL_COMMUNITY)
Admission: RE | Disposition: A | Discharge status: Home / Self Care | Payer: Self-pay | Source: Home / Self Care | Attending: Internal Medicine

## 2021-07-26 ENCOUNTER — Ambulatory Visit (HOSPITAL_BASED_OUTPATIENT_CLINIC_OR_DEPARTMENT_OTHER): Payer: No Typology Code available for payment source | Admitting: Anesthesiology

## 2021-07-26 ENCOUNTER — Encounter (HOSPITAL_COMMUNITY): Payer: Self-pay

## 2021-07-26 DIAGNOSIS — R1013 Epigastric pain: Secondary | ICD-10-CM | POA: Diagnosis not present

## 2021-07-26 DIAGNOSIS — F419 Anxiety disorder, unspecified: Secondary | ICD-10-CM | POA: Diagnosis not present

## 2021-07-26 DIAGNOSIS — F32A Depression, unspecified: Secondary | ICD-10-CM | POA: Insufficient documentation

## 2021-07-26 DIAGNOSIS — I129 Hypertensive chronic kidney disease with stage 1 through stage 4 chronic kidney disease, or unspecified chronic kidney disease: Secondary | ICD-10-CM | POA: Insufficient documentation

## 2021-07-26 DIAGNOSIS — I10 Essential (primary) hypertension: Secondary | ICD-10-CM | POA: Diagnosis not present

## 2021-07-26 DIAGNOSIS — K529 Noninfective gastroenteritis and colitis, unspecified: Secondary | ICD-10-CM | POA: Diagnosis present

## 2021-07-26 DIAGNOSIS — N189 Chronic kidney disease, unspecified: Secondary | ICD-10-CM | POA: Diagnosis not present

## 2021-07-26 DIAGNOSIS — K2970 Gastritis, unspecified, without bleeding: Secondary | ICD-10-CM | POA: Diagnosis not present

## 2021-07-26 DIAGNOSIS — K760 Fatty (change of) liver, not elsewhere classified: Secondary | ICD-10-CM | POA: Insufficient documentation

## 2021-07-26 DIAGNOSIS — Z79899 Other long term (current) drug therapy: Secondary | ICD-10-CM | POA: Diagnosis not present

## 2021-07-26 DIAGNOSIS — Z6841 Body Mass Index (BMI) 40.0 and over, adult: Secondary | ICD-10-CM | POA: Diagnosis not present

## 2021-07-26 DIAGNOSIS — K219 Gastro-esophageal reflux disease without esophagitis: Secondary | ICD-10-CM | POA: Diagnosis not present

## 2021-07-26 DIAGNOSIS — K449 Diaphragmatic hernia without obstruction or gangrene: Secondary | ICD-10-CM | POA: Diagnosis not present

## 2021-07-26 HISTORY — PX: ESOPHAGOGASTRODUODENOSCOPY (EGD) WITH PROPOFOL: SHX5813

## 2021-07-26 HISTORY — PX: BIOPSY: SHX5522

## 2021-07-26 HISTORY — PX: COLONOSCOPY WITH PROPOFOL: SHX5780

## 2021-07-26 SURGERY — COLONOSCOPY WITH PROPOFOL
Anesthesia: General

## 2021-07-26 MED ORDER — PROPOFOL 10 MG/ML IV BOLUS
INTRAVENOUS | Status: DC | PRN
  Administered 2021-07-26: 30 mg via INTRAVENOUS
  Administered 2021-07-26: 40 mg via INTRAVENOUS
  Administered 2021-07-26: 30 mg via INTRAVENOUS
  Administered 2021-07-26: 100 mg via INTRAVENOUS

## 2021-07-26 MED ORDER — LIDOCAINE 2% (20 MG/ML) 5 ML SYRINGE
INTRAMUSCULAR | Status: DC | PRN
  Administered 2021-07-26: 50 mg via INTRAVENOUS

## 2021-07-26 MED ORDER — LACTATED RINGERS IV SOLN: INTRAVENOUS | Status: DC

## 2021-07-26 MED ORDER — OMEPRAZOLE 20 MG PO CPDR
20.0000 mg | DELAYED_RELEASE_CAPSULE | Freq: Two times a day (BID) | ORAL | 5 refills | Status: DC
  Filled 2021-07-26: qty 60, 30d supply, fill #0
  Filled 2021-08-24: qty 60, 30d supply, fill #1
  Filled 2021-09-27: qty 60, 30d supply, fill #2
  Filled 2021-10-29: qty 60, 30d supply, fill #3
  Filled 2021-11-30: qty 60, 30d supply, fill #4
  Filled 2022-01-01: qty 60, 30d supply, fill #5

## 2021-07-26 MED ORDER — PROPOFOL 500 MG/50ML IV EMUL
INTRAVENOUS | Status: DC | PRN
Start: 2021-07-26 — End: 2021-07-26
  Administered 2021-07-26: 250 ug/kg/min via INTRAVENOUS

## 2021-07-26 NOTE — Anesthesia Preprocedure Evaluation (Signed)
Anesthesia Evaluation  Patient identified by MRN, date of birth, ID band Patient awake    Reviewed: Allergy & Precautions, NPO status , Patient's Chart, lab work & pertinent test results  Airway Mallampati: II  TM Distance: >3 FB Neck ROM: Full    Dental  (+) Dental Advisory Given, Chipped,    Pulmonary neg pulmonary ROS,    Pulmonary exam normal breath sounds clear to auscultation       Cardiovascular hypertension, Pt. on medications Normal cardiovascular exam Rhythm:Regular Rate:Normal     Neuro/Psych PSYCHIATRIC DISORDERS Anxiety Depression negative neurological ROS     GI/Hepatic Neg liver ROS, GERD  Medicated and Controlled,  Endo/Other  Morbid obesity  Renal/GU negative Renal ROS  negative genitourinary   Musculoskeletal negative musculoskeletal ROS (+)   Abdominal   Peds negative pediatric ROS (+)  Hematology negative hematology ROS (+)   Anesthesia Other Findings   Reproductive/Obstetrics negative OB ROS                           Anesthesia Physical Anesthesia Plan  ASA: 3  Anesthesia Plan: General   Post-op Pain Management: Minimal or no pain anticipated   Induction: Intravenous  PONV Risk Score and Plan: TIVA  Airway Management Planned: Nasal Cannula and Natural Airway  Additional Equipment:   Intra-op Plan:   Post-operative Plan:   Informed Consent: I have reviewed the patients History and Physical, chart, labs and discussed the procedure including the risks, benefits and alternatives for the proposed anesthesia with the patient or authorized representative who has indicated his/her understanding and acceptance.     Dental advisory given  Plan Discussed with: CRNA and Surgeon  Anesthesia Plan Comments:         Anesthesia Quick Evaluation

## 2021-07-26 NOTE — Op Note (Signed)
[facility] Patient Name: Elizabeth Foster Procedure Date: 07/26/2021 11:21 AM MRN: [id number] Date of Birth: 08-08-1985 Attending MD: Elon Alas. Abbey Chatters DO CSN: [id number] Age: 36 Admit Type: Outpatient Procedure:                Colonoscopy Indications:              Chronic diarrhea Providers:                Elon Alas. Abbey Chatters, DO, Jessica Boudreaux, Janeece Riggers, RN Referring MD:              Medicines:                See the Anesthesia note for documentation of the                            administered medications Complications:            No immediate complications. Estimated Blood Loss:     Estimated blood loss: none. Procedure:                Pre-Anesthesia Assessment:                           - The anesthesia plan was to use monitored                            anesthesia care (MAC).                           After obtaining informed consent, the colonoscope                            was passed under direct vision. Throughout the                            procedure, the patient's blood pressure, pulse, and                            oxygen saturations were monitored continuously. The                            PCF-HQ190L ([id number]) scope was introduced through                            the anus and advanced to the the cecum, identified                            by appendiceal orifice and ileocecal valve. The                            colonoscopy was performed without difficulty. The                            patient tolerated the procedure well. The quality  of the bowel preparation was evaluated using the                            BBPS [facility] Bowel Preparation Scale) with scores                            of: Right Colon = 2 (minor amount of residual                            staining, small fragments of stool and/or opaque                            liquid, but mucosa seen well), Transverse Colon = 3                             (entire mucosa seen well with no residual staining,                            small fragments of stool or opaque liquid) and Left                            Colon = 3 (entire mucosa seen well with no residual                            staining, small fragments of stool or opaque                            liquid). The total BBPS score equals 8. The quality                            of the bowel preparation was good. Scope In: 11:44:13 AM Scope Out: 11:54:12 AM Scope Withdrawal Time: 0 hours 7 minutes 16 seconds  Total Procedure Duration: 0 hours 9 minutes 59 seconds  Findings:      The perianal and digital rectal examinations were normal.      The terminal ileum appeared normal.      Biopsies for histology were taken with a cold forceps from the ascending       colon, transverse colon and descending colon for evaluation of       microscopic colitis.      The exam was otherwise without abnormality. Impression:               - The examined portion of the ileum was normal.                           - The examination was otherwise normal.                           - Biopsies were taken with a cold forceps from the                            ascending colon, transverse colon and descending  colon for evaluation of microscopic colitis. Moderate Sedation:      Per Anesthesia Care Recommendation:           - Patient has a contact number available for                            emergencies. The signs and symptoms of potential                            delayed complications were discussed with the                            patient. Return to normal activities tomorrow.                            Written discharge instructions were provided to the                            patient.                           - Resume previous diet.                           - Continue present medications.                           - Await pathology results.                            - Repeat colonoscopy in 10 years for screening                            purposes.                           - Return to GI clinic in 4 months. Procedure Code(s):        --- Professional ---                           [phone], Colonoscopy, flexible; with biopsy, single                            or multiple Diagnosis Code(s):        --- Professional ---                           K52.9, Noninfective gastroenteritis and colitis,                            unspecified CPT copyright 2019 American Medical Association. All rights reserved. The codes documented in this report are preliminary and upon coder review may  be revised to meet current compliance requirements. Elon Alas. Abbey Chatters, DO [place] Abbey Chatters, DO 07/26/2021 11:57:42 AM This report has been signed electronically. Number of Addenda: 0

## 2021-07-26 NOTE — Discharge Instructions (Addendum)
EGD Discharge instructions Please read the instructions outlined below and refer to this sheet in the next few weeks. These discharge instructions provide you with general information on caring for yourself after you leave the hospital. Your doctor may also give you specific instructions. While your treatment has been planned according to the most current medical practices available, unavoidable complications occasionally occur. If you have any problems or questions after discharge, please call your doctor. ACTIVITY You may resume your regular activity but move at a slower pace for the next 24 hours.  Take frequent rest periods for the next 24 hours.  Walking will help expel (get rid of) the air and reduce the bloated feeling in your abdomen.  No driving for 24 hours (because of the anesthesia (medicine) used during the test).  You may shower.  Do not sign any important legal documents or operate any machinery for 24 hours (because of the anesthesia used during the test).  NUTRITION Drink plenty of fluids.  You may resume your normal diet.  Begin with a light meal and progress to your normal diet.  Avoid alcoholic beverages for 24 hours or as instructed by your caregiver.  MEDICATIONS You may resume your normal medications unless your caregiver tells you otherwise.  WHAT YOU CAN EXPECT TODAY You may experience abdominal discomfort such as a feeling of fullness or gas pains.  FOLLOW-UP Your doctor will discuss the results of your test with you.  SEEK IMMEDIATE MEDICAL ATTENTION IF ANY OF THE FOLLOWING OCCUR: Excessive nausea (feeling sick to your stomach) and/or vomiting.  Severe abdominal pain and distention (swelling).  Trouble swallowing.  Temperature over 101 F (37.8 C).  Rectal bleeding or vomiting of blood.     Colonoscopy Discharge Instructions  Read the instructions outlined below and refer to this sheet in the next few weeks. These discharge instructions provide you with  general information on caring for yourself after you leave the hospital. Your doctor may also give you specific instructions. While your treatment has been planned according to the most current medical practices available, unavoidable complications occasionally occur.   ACTIVITY You may resume your regular activity, but move at a slower pace for the next 24 hours.  Take frequent rest periods for the next 24 hours.  Walking will help get rid of the air and reduce the bloated feeling in your belly (abdomen).  No driving for 24 hours (because of the medicine (anesthesia) used during the test).   Do not sign any important legal documents or operate any machinery for 24 hours (because of the anesthesia used during the test).  NUTRITION Drink plenty of fluids.  You may resume your normal diet as instructed by your doctor.  Begin with a light meal and progress to your normal diet. Heavy or fried foods are harder to digest and may make you feel sick to your stomach (nauseated).  Avoid alcoholic beverages for 24 hours or as instructed.  MEDICATIONS You may resume your normal medications unless your doctor tells you otherwise.  WHAT YOU CAN EXPECT TODAY Some feelings of bloating in the abdomen.  Passage of more gas than usual.  Spotting of blood in your stool or on the toilet paper.  IF YOU HAD POLYPS REMOVED DURING THE COLONOSCOPY: No aspirin products for 7 days or as instructed.  No alcohol for 7 days or as instructed.  Eat a soft diet for the next 24 hours.  FINDING OUT THE RESULTS OF YOUR TEST Not all test results are  available during your visit. If your test results are not back during the visit, make an appointment with your caregiver to find out the results. Do not assume everything is normal if you have not heard from your caregiver or the medical facility. It is important for you to follow up on all of your test results.  SEEK IMMEDIATE MEDICAL ATTENTION IF: You have more than a spotting of  blood in your stool.  Your belly is swollen (abdominal distention).  You are nauseated or vomiting.  You have a temperature over 101.  You have abdominal pain or discomfort that is severe or gets worse throughout the day.   Your EGD revealed mild amount inflammation in your stomach.  I took biopsies of this to rule out infection with a bacteria called H. pylori.  Await pathology results, my office will contact you.  I also took biopsies of your small bowel to rule out celiac disease.  You have a medium size hiatal hernia which is likely also playing a role in your epigastric discomfort.  Your colon looked healthy.  I did not find any polyps or evidence of colon cancer.  I did not find any active inflammation indicative of underlying inflammatory bowel disease such as ulcerative colitis or Crohn's disease.  I did take biopsies to rule out microscopic colitis which can cause chronically loose stools especially in women.  Recommend repeat colonoscopy in 10 years for screening purposes.  I am going to increase your omeprazole to twice daily and have sent this to your pharmacy.  Follow-up with GI in 4 months.   I hope you have a great rest of your week!  Elon Alas. Abbey Chatters, D.O. Gastroenterology and Hepatology [facility]

## 2021-07-26 NOTE — Interval H&P Note (Signed)
History and Physical Interval Note:  07/26/2021 11:10 AM  Elizabeth Foster  has presented today for surgery, with the diagnosis of change in bowels, chronic diarrhea, epigastric pain, nausea, gerd.  The various methods of treatment have been discussed with the patient and family. After consideration of risks, benefits and other options for treatment, the patient has consented to  Procedure(s) with comments: COLONOSCOPY WITH PROPOFOL (N/A) - 12:15pm ESOPHAGOGASTRODUODENOSCOPY (EGD) WITH PROPOFOL (N/A) as a surgical intervention.  The patient's history has been reviewed, patient examined, no change in status, stable for surgery.  I have reviewed the patient's chart and labs.  Questions were answered to the patient's satisfaction.     Eloise Harman

## 2021-07-26 NOTE — Anesthesia Postprocedure Evaluation (Signed)
Anesthesia Post Note  Patient: JOHNNY LATU  Procedure(s) Performed: COLONOSCOPY WITH PROPOFOL ESOPHAGOGASTRODUODENOSCOPY (EGD) WITH PROPOFOL BIOPSY  Patient location during evaluation: Phase II Anesthesia Type: General Level of consciousness: awake and alert and oriented Pain management: pain level controlled Vital Signs Assessment: post-procedure vital signs reviewed and stable Respiratory status: spontaneous breathing, nonlabored ventilation and respiratory function stable Cardiovascular status: blood pressure returned to baseline and stable Postop Assessment: no apparent nausea or vomiting Anesthetic complications: no   No notable events documented.   Last Vitals:  Vitals:   07/26/21 1103 07/26/21 1159  BP: 135/86 100/60  Pulse: (!) 105 86  Resp: 18 15  Temp: 36.7 C 37.2 C  SpO2: 98% (!) 89%    Last Pain:  Vitals:   07/26/21 1159  TempSrc: Oral  PainSc: 0-No pain                 Katessa Attridge C Dmoni Fortson

## 2021-07-26 NOTE — Op Note (Signed)
[facility] Patient Name: Elizabeth Foster Procedure Date: 07/26/2021 11:22 AM MRN: [id number] Date of Birth: [birth year] Attending MD: Elon Alas. Abbey Chatters DO CSN: [id number] Age: 36 Admit Type: Outpatient Procedure:                Upper GI endoscopy Indications:              Epigastric abdominal pain, Heartburn, Diarrhea Providers:                Elon Alas. Abbey Chatters, DO, Jessica Boudreaux, Janeece Riggers, RN Referring MD:              Medicines:                See the Anesthesia note for documentation of the                            administered medications Complications:            No immediate complications. Estimated Blood Loss:     Estimated blood loss was minimal. Procedure:                Pre-Anesthesia Assessment:                           - The anesthesia plan was to use monitored                            anesthesia care (MAC).                           After obtaining informed consent, the endoscope was                            passed under direct vision. Throughout the                            procedure, the patient's blood pressure, pulse, and                            oxygen saturations were monitored continuously. The                            GIF-H190 ([id number]) scope was introduced through the                            mouth, and advanced to the second part of duodenum.                            The upper GI endoscopy was accomplished without                            difficulty. The patient tolerated the procedure                            well. Scope In: 11:34:00  AM Scope Out: 11:38:43 AM Total Procedure Duration: 0 hours 4 minutes 43 seconds  Findings:      A medium-sized 5 cm hiatal hernia was found. The proximal extent of the       gastric folds (end of tubular esophagus) was 33 cm from the incisors.       The hiatal narrowing was 38 cm from the incisors.      Patchy mild inflammation characterized by erythema was found in  the       gastric body. Biopsies were taken with a cold forceps for Helicobacter       pylori testing.      The duodenal bulb, first portion of the duodenum and second portion of       the duodenum were normal. Biopsies for histology were taken with a cold       forceps for evaluation of celiac disease. Impression:               - Medium-sized hiatal hernia.                           - Gastritis. Biopsied.                           - Normal duodenal bulb, first portion of the                            duodenum and second portion of the duodenum.                            Biopsied. Moderate Sedation:      Per Anesthesia Care Recommendation:           - Patient has a contact number available for                            emergencies. The signs and symptoms of potential                            delayed complications were discussed with the                            patient. Return to normal activities tomorrow.                            Written discharge instructions were provided to the                            patient.                           - Resume previous diet.                           - Continue present medications.                           - Await pathology results.                           -  Use Prilosec (omeprazole) 20 mg PO BID.                           - Return to GI clinic in 4 months. Procedure Code(s):        --- Professional ---                           [phone], Esophagogastroduodenoscopy, flexible,                            transoral; with biopsy, single or multiple Diagnosis Code(s):        --- Professional ---                           K44.9, Diaphragmatic hernia without obstruction or                            gangrene                           K29.70, Gastritis, unspecified, without bleeding                           R10.13, Epigastric pain                           R12, Heartburn                           R19.7, Diarrhea, unspecified CPT copyright  2019 American Medical Association. All rights reserved. The codes documented in this report are preliminary and upon coder review may  be revised to meet current compliance requirements. Elon Alas. Abbey Chatters, DO [place] Abbey Chatters, DO 07/26/2021 11:43:29 AM This report has been signed electronically. Number of Addenda: 0

## 2021-07-26 NOTE — Transfer of Care (Signed)
Immediate Anesthesia Transfer of Care Note  Patient: Elizabeth Foster  Procedure(s) Performed: COLONOSCOPY WITH PROPOFOL ESOPHAGOGASTRODUODENOSCOPY (EGD) WITH PROPOFOL BIOPSY  Patient Location: Short Stay  Anesthesia Type:MAC  Level of Consciousness: awake, alert , oriented and patient cooperative  Airway & Oxygen Therapy: Patient Spontanous Breathing and Patient connected to nasal cannula oxygen  Post-op Assessment: Report given to RN, Post -op Vital signs reviewed and stable and Patient moving all extremities  Post vital signs: Reviewed and stable  Last Vitals:  Vitals Value Taken Time  BP 100/60 07/26/21 1159  Temp 37.2 C 07/26/21 1159  Pulse 86 07/26/21 1159  Resp 15 07/26/21 1159  SpO2 89 % 07/26/21 1159    Last Pain:  Vitals:   07/26/21 1159  TempSrc: Oral  PainSc: 0-No pain         Complications: No notable events documented.

## 2021-07-30 LAB — SURGICAL PATHOLOGY

## 2021-07-31 ENCOUNTER — Encounter (HOSPITAL_COMMUNITY): Payer: Self-pay | Admitting: Internal Medicine

## 2021-07-31 ENCOUNTER — Encounter: Payer: Self-pay | Admitting: *Deleted

## 2021-07-31 ENCOUNTER — Other Ambulatory Visit: Payer: Self-pay | Admitting: *Deleted

## 2021-07-31 DIAGNOSIS — R197 Diarrhea, unspecified: Secondary | ICD-10-CM

## 2021-07-31 DIAGNOSIS — G8929 Other chronic pain: Secondary | ICD-10-CM

## 2021-08-07 ENCOUNTER — Encounter: Payer: Self-pay | Admitting: Family Medicine

## 2021-08-07 NOTE — Telephone Encounter (Signed)
Nurses ?I am sympathetic with the situation ?My understanding is ?[place] have approval for obesity ?By the FDA ? ?Ozempic has approval by the FDA for diabetes with subpar control.  But technically does not have approval for weight loss by the FDA. ? ?Policies can differ from what insurance company to another.  She would need to specifically find out from her insurance if Ozempic is covered for weight loss with prediabetes. ? ?Undoubtably many of these medications will need prior approval but prior approval if the policy says covered for diabetes only will not go through if the patient is prediabetic ? ?Therefore we are willing to help as best we can ?But we are placing the responsibility with the patient to find out if this is covered in her situation of obesity with prediabetes thank you ? ?

## 2021-08-07 NOTE — Telephone Encounter (Signed)
Patient advised per Dr Nicki Reaper: He is sympathetic with the situation ?Dr Bary Leriche understanding is ?[place] have approval for obesity ?By the FDA ?  ?Ozempic has approval by the FDA for diabetes with subpar control.  But technically does not have approval for weight loss by the FDA. ?  ?Policies can differ from what insurance company to another.  She would need to specifically find out from her insurance if Ozempic is covered for weight loss with prediabetes. ?  ?Undoubtably many of these medications will need prior approval but prior approval if the policy says covered for diabetes only will not go through if the patient is prediabetic ? ?Patient verbalized understanding and stated she will call her insurance to see if the medication would be covered with out a diagnosis of diabetes ?

## 2021-08-08 ENCOUNTER — Ambulatory Visit (HOSPITAL_COMMUNITY): Payer: No Typology Code available for payment source

## 2021-08-22 ENCOUNTER — Ambulatory Visit (HOSPITAL_COMMUNITY): Payer: No Typology Code available for payment source

## 2021-08-25 ENCOUNTER — Other Ambulatory Visit (HOSPITAL_COMMUNITY): Payer: Self-pay

## 2021-09-27 ENCOUNTER — Other Ambulatory Visit: Payer: Self-pay | Admitting: Family Medicine

## 2021-09-27 ENCOUNTER — Other Ambulatory Visit (HOSPITAL_COMMUNITY): Payer: Self-pay

## 2021-09-28 ENCOUNTER — Other Ambulatory Visit (HOSPITAL_COMMUNITY): Payer: Self-pay

## 2021-09-28 MED ORDER — SERTRALINE HCL 50 MG PO TABS
ORAL_TABLET | Freq: Every day | ORAL | 0 refills | Status: DC
  Filled 2021-09-28: qty 135, 90d supply, fill #0

## 2021-10-07 ENCOUNTER — Other Ambulatory Visit: Payer: Self-pay | Admitting: Family Medicine

## 2021-10-09 NOTE — Telephone Encounter (Signed)
May have 90 days needs to do follow-up visit by June/July ?

## 2021-10-10 ENCOUNTER — Other Ambulatory Visit (HOSPITAL_COMMUNITY): Payer: Self-pay

## 2021-10-10 MED ORDER — PRAMIPEXOLE DIHYDROCHLORIDE 1 MG PO TABS
ORAL_TABLET | Freq: Every day | ORAL | 0 refills | Status: DC
Start: 2021-10-10 — End: 2022-01-01
  Filled 2021-10-10: qty 90, 90d supply, fill #0

## 2021-10-10 NOTE — Telephone Encounter (Signed)
Patient informed of md message. Verbalized understanding. ?

## 2021-10-26 NOTE — Telephone Encounter (Signed)
Nurses I am willing to prescribe Wegovy But we need to do a up-to-date office visit to establish her situation and get a current weight plus also go over how Wegovy is ramped up over the first 3 months and go over the specifics with the medication  Please set up for office visit

## 2021-10-30 ENCOUNTER — Other Ambulatory Visit (HOSPITAL_COMMUNITY): Payer: Self-pay

## 2021-11-27 ENCOUNTER — Encounter: Payer: Self-pay | Admitting: Internal Medicine

## 2021-11-30 ENCOUNTER — Other Ambulatory Visit (HOSPITAL_COMMUNITY): Payer: Self-pay

## 2021-12-10 ENCOUNTER — Other Ambulatory Visit (HOSPITAL_COMMUNITY): Payer: Self-pay

## 2021-12-18 ENCOUNTER — Other Ambulatory Visit: Payer: Self-pay | Admitting: Family Medicine

## 2021-12-18 ENCOUNTER — Other Ambulatory Visit (HOSPITAL_COMMUNITY): Payer: Self-pay

## 2021-12-18 MED ORDER — SERTRALINE HCL 50 MG PO TABS
ORAL_TABLET | Freq: Every day | ORAL | 0 refills | Status: DC
Start: 2021-12-18 — End: 2022-01-17
  Filled 2021-12-18: qty 135, 90d supply, fill #0

## 2022-01-01 ENCOUNTER — Other Ambulatory Visit: Payer: Self-pay | Admitting: Family Medicine

## 2022-01-01 ENCOUNTER — Other Ambulatory Visit (HOSPITAL_COMMUNITY): Payer: Self-pay

## 2022-01-01 MED ORDER — PRAMIPEXOLE DIHYDROCHLORIDE 1 MG PO TABS
ORAL_TABLET | Freq: Every day | ORAL | 0 refills | Status: DC
  Filled 2022-01-01: qty 90, 90d supply, fill #0

## 2022-01-02 ENCOUNTER — Other Ambulatory Visit (HOSPITAL_COMMUNITY): Payer: Self-pay

## 2022-01-04 ENCOUNTER — Encounter: Payer: Self-pay | Admitting: Family Medicine

## 2022-01-08 ENCOUNTER — Other Ambulatory Visit (HOSPITAL_COMMUNITY): Payer: Self-pay

## 2022-01-08 MED ORDER — WEGOVY 0.25 MG/0.5ML ~~LOC~~ SOAJ
SUBCUTANEOUS | 0 refills | Status: DC
Start: 2022-01-08 — End: 2022-04-09
  Filled 2022-01-08: qty 2, 28d supply, fill #0

## 2022-01-08 NOTE — Telephone Encounter (Signed)
I would recommend an up-to-date office visit for height weight and discussion.  It would be beneficial for [place] to do a compelling letter of necessity based around a more up-to-date visit and bariatric measurements thank you

## 2022-01-09 ENCOUNTER — Encounter (INDEPENDENT_AMBULATORY_CARE_PROVIDER_SITE_OTHER): Payer: Self-pay

## 2022-01-11 ENCOUNTER — Other Ambulatory Visit (HOSPITAL_COMMUNITY): Payer: Self-pay

## 2022-01-17 ENCOUNTER — Encounter: Payer: Self-pay | Admitting: Family Medicine

## 2022-01-17 ENCOUNTER — Other Ambulatory Visit (HOSPITAL_COMMUNITY): Payer: Self-pay

## 2022-01-17 ENCOUNTER — Telehealth: Payer: Self-pay | Admitting: Family Medicine

## 2022-01-17 ENCOUNTER — Ambulatory Visit (INDEPENDENT_AMBULATORY_CARE_PROVIDER_SITE_OTHER): Payer: No Typology Code available for payment source | Admitting: Family Medicine

## 2022-01-17 VITALS — BP 124/78 | HR 94 | Ht 63.5 in | Wt 288.8 lb

## 2022-01-17 DIAGNOSIS — Z6841 Body Mass Index (BMI) 40.0 and over, adult: Secondary | ICD-10-CM

## 2022-01-17 DIAGNOSIS — G2581 Restless legs syndrome: Secondary | ICD-10-CM | POA: Diagnosis not present

## 2022-01-17 DIAGNOSIS — F419 Anxiety disorder, unspecified: Secondary | ICD-10-CM | POA: Diagnosis not present

## 2022-01-17 DIAGNOSIS — F32A Depression, unspecified: Secondary | ICD-10-CM

## 2022-01-17 MED ORDER — PRAMIPEXOLE DIHYDROCHLORIDE 1 MG PO TABS
1.0000 mg | ORAL_TABLET | Freq: Every day | ORAL | 0 refills | Status: DC
  Filled 2022-01-17: qty 90, fill #0
  Filled 2022-03-30: qty 90, 90d supply, fill #0

## 2022-01-17 MED ORDER — SERTRALINE HCL 50 MG PO TABS
75.0000 mg | ORAL_TABLET | Freq: Every day | ORAL | 1 refills | Status: DC
  Filled 2022-01-17: qty 135, fill #0
  Filled 2022-03-15: qty 135, 90d supply, fill #0
  Filled 2022-06-18 (×2): qty 45, 30d supply, fill #1
  Filled 2022-07-17: qty 45, 30d supply, fill #2
  Filled 2022-08-13: qty 45, 30d supply, fill #3

## 2022-01-17 MED ORDER — MECLIZINE HCL 25 MG PO TABS
25.0000 mg | ORAL_TABLET | Freq: Three times a day (TID) | ORAL | 4 refills | Status: DC | PRN
  Filled 2022-01-17: qty 30, 10d supply, fill #0

## 2022-01-17 NOTE — Progress Notes (Signed)
Please print and fax this with the document that I filled out for [facility] surgery

## 2022-01-17 NOTE — Progress Notes (Signed)
   Subjective:    Patient ID: Elizabeth Foster, female    DOB: 1986-03-20, 36 y.o.   MRN: [id number]  HPI Pt here to discuss weight loss surgery. Pt has appointment tomorrow with weight loss surgeon. Pt has experienced weight loss and skin tags. Pt began [facility] on Sunday. Pt would like refill on Antivert  Significant morbid obesity Patient does babble this all of her adult life It got worse during the pandemic and after having 3 kids Because of the amount of weight she has causes significant bilateral hip and knee pain and ankle pain. She also has hyperlipidemia She is unable to do physical exercise because of her weight She has significant joint discomfort even with rest Takes OTC measures She does take her Zoloft for depression this is under good control She does have restless legs  She has tried weight watchers, phenteramine use, Atkins diet, keto diet, low-carb diet, she is utilize fitness tracker's, she is try to do extra walking, none of this is led to any sustained weight loss Currently using Wegovy at low-dose it is helping but she realizes this is not a long-term solution  She has realistic goals regarding weight loss She would like to undergo gastric sleeve   Review of Systems     Objective:   Physical Exam General-in no acute distress Eyes-no discharge Lungs-respiratory rate normal, CTA CV-no murmurs,RRR Extremities skin warm dry no edema Neuro grossly normal Behavior normal, alert        Assessment & Plan:  Morbid obesity Clinically it is indicated for her to get gastric surgery to reduce her weight Wegovy certainly will help but it is a lifelong medicine She also has increased risk of cardiovascular disease because of hyperlipidemia as well as morbid obesity Her severe morbid obesity makes it difficult for her to exercise She has tried reasonable attempts with weight watchers phenteramine and calorie tracking without success It is my opinion she would  benefit from having the gastric sleeve surgery  Doing well with depression continue current medication  As for the restless legs continue current medication  Follow-up by springtime

## 2022-01-17 NOTE — Telephone Encounter (Signed)
Please send a copy of the letter as well as a form I filled out to [place] [facility] via fax this morning thank you

## 2022-01-18 ENCOUNTER — Other Ambulatory Visit: Payer: Self-pay | Admitting: General Surgery

## 2022-01-18 ENCOUNTER — Other Ambulatory Visit (HOSPITAL_COMMUNITY): Payer: Self-pay | Admitting: General Surgery

## 2022-01-18 ENCOUNTER — Other Ambulatory Visit: Payer: Self-pay | Admitting: Internal Medicine

## 2022-01-18 DIAGNOSIS — K219 Gastro-esophageal reflux disease without esophagitis: Secondary | ICD-10-CM

## 2022-01-24 ENCOUNTER — Other Ambulatory Visit: Payer: Self-pay

## 2022-01-24 ENCOUNTER — Ambulatory Visit (HOSPITAL_COMMUNITY)
Admission: RE | Admit: 2022-01-24 | Discharge: 2022-01-24 | Disposition: A | Discharge status: Home / Self Care | Payer: No Typology Code available for payment source | Source: Ambulatory Visit | Attending: General Surgery | Admitting: General Surgery

## 2022-01-24 ENCOUNTER — Encounter (HOSPITAL_COMMUNITY)
Admission: RE | Admit: 2022-01-24 | Discharge: 2022-01-24 | Disposition: A | Discharge status: Home / Self Care | Payer: No Typology Code available for payment source | Source: Ambulatory Visit | Attending: General Surgery | Admitting: General Surgery

## 2022-01-24 DIAGNOSIS — K219 Gastro-esophageal reflux disease without esophagitis: Secondary | ICD-10-CM | POA: Insufficient documentation

## 2022-01-25 ENCOUNTER — Ambulatory Visit (HOSPITAL_COMMUNITY): Payer: No Typology Code available for payment source | Admitting: Licensed Clinical Social Worker

## 2022-01-25 ENCOUNTER — Ambulatory Visit: Payer: No Typology Code available for payment source | Admitting: Dietician

## 2022-01-28 ENCOUNTER — Other Ambulatory Visit (HOSPITAL_COMMUNITY): Payer: Self-pay

## 2022-01-28 MED ORDER — OMEPRAZOLE 20 MG PO CPDR
20.0000 mg | DELAYED_RELEASE_CAPSULE | Freq: Two times a day (BID) | ORAL | 5 refills | Status: DC
  Filled 2022-01-28: qty 60, 30d supply, fill #0
  Filled 2022-03-15: qty 60, 30d supply, fill #1
  Filled 2022-04-11: qty 60, 30d supply, fill #2

## 2022-01-28 MED ORDER — WEGOVY 0.5 MG/0.5ML ~~LOC~~ SOAJ
SUBCUTANEOUS | 0 refills | Status: DC
  Filled 2022-01-28 – 2022-03-13 (×2): qty 2, 28d supply, fill #0

## 2022-01-29 ENCOUNTER — Other Ambulatory Visit (HOSPITAL_COMMUNITY): Payer: Self-pay

## 2022-02-07 ENCOUNTER — Encounter: Payer: No Typology Code available for payment source | Attending: Family Medicine | Admitting: Dietician

## 2022-02-07 ENCOUNTER — Encounter: Payer: No Typology Code available for payment source | Admitting: Dietician

## 2022-02-07 ENCOUNTER — Other Ambulatory Visit (HOSPITAL_COMMUNITY): Payer: Self-pay

## 2022-02-07 ENCOUNTER — Encounter: Payer: Self-pay | Admitting: Dietician

## 2022-02-07 DIAGNOSIS — E669 Obesity, unspecified: Secondary | ICD-10-CM | POA: Insufficient documentation

## 2022-02-07 NOTE — Progress Notes (Signed)
Nutrition Assessment for Bariatric Surgery Medical Nutrition Therapy Appt Start Time: 2:00    End Time: 2:56  Patient was seen on 02/07/2022 for Pre-Operative Nutrition Assessment. Letter of approval faxed to [facility] bariatric surgery program coordinator on 02/07/2022.   Referral stated Supervised Weight Loss (SWL) visits needed: 0  Pt completed visits.   Pt has cleared nutrition requirements.   Planned surgery: Sleeve Gastrectomy    NUTRITION ASSESSMENT   Anthropometrics  Start weight at [facility]: 290.1 lbs (date: 02/07/2022)  Height: 64 in BMI: 49.80 kg/m2     Clinical  Medical hx: IBS, GERD, obesity, high cholesterol, HTN, anxiety Medications: Omeprazole, Zoloft, multivitamin, fish oil, Mirapex vit d, Allegra Wegovy  Labs:  Notable signs/symptoms: none noted Any previous deficiencies? No  Micronutrient Nutrition Focused Physical Exam: Hair: No issues observed Eyes: No issues observed Mouth: No issues observed Neck: No issues observed Nails: No issues observed Skin: No issues observed  Lifestyle & Dietary Hx  Patient lives with husband and 3 children. The pt performs the food shopping and the pt prepares the meals. She reports that she typically skips or misses 5-6 out of 21 possible meals per week. She may have 10-12 meals per week that are take-out or at a restaurant.  Patient works as Counselling psychologist at [employer]. She denies binge eating and denies feeling shame and/or guilt after eating too much food.  She denies having used laxatives or vomiting to facilitate weight loss. She admits to emotional eating during times of stress or anxiety. She states that she knows the difference between hunger and thirst and can tell when she is full. Pt states she has reduced soda from 3 to one, in preparation for weight loss after surgery, stating her goal is to be free from soda by surgery. Pt states she feels full when she is on the [facility].  Physical  Activity: ADLs, walking 2 times a week for about 30 minutes  Sleep Hygiene: duration and quality: good, 8-9 hours  Current Patient Perceived Stress Level as stated by pt on a scale of 1-10: 6      Stress Management Techniques: hobbies: crafts, reading, crochet, audio books  Fruit servings per week: 2-3 Non starchy vegetable servings per week: 3-4 Whole Grains per week: 3-4  24-Hr Dietary Recall First Meal: core power protein shake Snack: lance crackers pack or scrambled eggs Second Meal: protein bar or granola bar or ham/cheese sandwich Snack:  Third Meal: grilled chicken or tacos or pizza Snack: cheese stick/slice or protein bar Beverages: soda (sun drop), coffee, water with flavorings, body armour drink  Alcoholic beverages per week: 0   Estimated Energy Needs Calories: 1500   NUTRITION DIAGNOSIS  Overweight/obesity ([place]-3.3) related to past poor dietary habits and physical inactivity as evidenced by patient w/ planned Sleeve Gastrectomy surgery following dietary guidelines for continued weight loss.    NUTRITION INTERVENTION  Nutrition counseling (C-1) and education (E-2) to facilitate bariatric surgery goals.  Educated pt on micronutrient deficiencies post surgery and strategies to mitigate that risk   Pre-Op Goals Reviewed with the Patient Track food and beverage intake (pen and paper, MyFitness Pal, Baritastic app, etc.) Make healthy food choices while monitoring portion sizes Consume 3 meals per day or try to eat every 3-5 hours Avoid concentrated sugars and fried foods Keep sugar & fat in the single digits per serving on food labels Practice CHEWING your food (aim for applesauce consistency) Practice not drinking 15 minutes before, during, and 30  minutes after each meal and snack Avoid all carbonated beverages (ex: soda, sparkling beverages)  Limit caffeinated beverages (ex: coffee, tea, energy drinks) Avoid all sugar-sweetened beverages (ex: regular soda, sports  drinks)  Avoid alcohol  Aim for 64-100 ounces of FLUID daily (with at least half of fluid intake being plain water)  Aim for at least 60-80 grams of PROTEIN daily Look for a liquid protein source that contains ?15 g protein and ?5 g carbohydrate (ex: shakes, drinks, shots) Make a list of non-food related activities Physical activity is an important part of a healthy lifestyle so keep it moving! The goal is to reach 150 minutes of exercise per week, including cardiovascular and weight baring activity.  *Goals that are bolded indicate the pt would like to start working towards these  Handouts Provided Include  Bariatric Surgery handouts (Nutrition Visits, Pre-Op Goals, Protein Shakes, Vitamins & Minerals)  Learning Style & Readiness for Change Teaching method utilized: Visual & Auditory  Demonstrated degree of understanding via: Teach Back  Readiness Level: preparation Barriers to learning/adherence to lifestyle change: nothing identified  RD's Notes for Next Visit     MONITORING & EVALUATION Dietary intake, weekly physical activity, body weight, and pre-op goals reached at next nutrition visit.    Next Steps  Pt has completed visits. No further supervised visits required/recomended  Patient is to follow up at [place] for Pre-Op Class >2 weeks before surgery for further nutrition education.

## 2022-02-08 ENCOUNTER — Other Ambulatory Visit (HOSPITAL_COMMUNITY): Payer: Self-pay

## 2022-02-21 ENCOUNTER — Ambulatory Visit (INDEPENDENT_AMBULATORY_CARE_PROVIDER_SITE_OTHER): Payer: No Typology Code available for payment source | Admitting: Licensed Clinical Social Worker

## 2022-02-21 ENCOUNTER — Ambulatory Visit: Payer: No Typology Code available for payment source | Admitting: Dietician

## 2022-02-21 DIAGNOSIS — F4320 Adjustment disorder, unspecified: Secondary | ICD-10-CM
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
## 2022-03-09 ENCOUNTER — Telehealth: Payer: No Typology Code available for payment source | Admitting: Physician Assistant

## 2022-03-09 DIAGNOSIS — L0390 Cellulitis, unspecified: Secondary | ICD-10-CM | POA: Diagnosis not present

## 2022-03-09 MED ORDER — DOXYCYCLINE HYCLATE 100 MG PO TABS: 100.0000 mg | ORAL_TABLET | Freq: Two times a day (BID) | ORAL | 0 refills | Status: DC

## 2022-03-09 NOTE — Progress Notes (Signed)
I have spent 5 minutes in review of e-visit questionnaire, review and updating patient chart, medical decision making and response to patient.   Elmyra Banwart Cody Donnamarie Shankles, PA-C    

## 2022-03-09 NOTE — Progress Notes (Signed)
E Visit for Cellulitis  We are sorry that you are not feeling well. Here is how we plan to help!  Based on what you shared with me it looks like you have cellulitis of skin at site of possible bite. I will say that the way the area looks and mention initially of stinging/burning, etc, you do also worry about a mild shingles rash initially, now with a secondary cellulitis of the skin. If it were a very mild case of shingles, we are outside the window for antiviral treatment, but you want to keep the area covered to prevent possible exposure to others.   I have prescribed:  Doxycycline -- 1 tablet by mouth twice a day for 7 days  HOME CARE:  Take your medications as ordered and take all of them, even if the skin irritation appears to be healing.   GET HELP RIGHT AWAY IF:  Symptoms that don't begin to go away within 48 hours. Severe redness persists or worsens If the area turns color, spreads or swells. If it blisters and opens, develops yellow-brown crust or bleeds. You develop a fever or chills. If the pain increases or becomes unbearable.  Are unable to keep fluids and food down.  MAKE SURE YOU   Understand these instructions. Will watch your condition. Will get help right away if you are not doing well or get worse.  Thank you for choosing an e-visit.  Your e-visit answers were reviewed by a board certified advanced clinical practitioner to complete your personal care plan. Depending upon the condition, your plan could have included both over the counter or prescription medications.  Please review your pharmacy choice. Make sure the pharmacy is open so you can pick up prescription now. If there is a problem, you may contact your provider through [employer] and have the prescription routed to another pharmacy.  Your safety is important to [place]. If you have drug allergies check your prescription carefully.   For the next 24 hours you can use MyChart to ask questions about  today's visit, request a non-urgent call back, or ask for a work or school excuse. You will get an email in the next two days asking about your experience. I hope that your e-visit has been valuable and will speed your recovery.

## 2022-03-13 ENCOUNTER — Other Ambulatory Visit (HOSPITAL_COMMUNITY): Payer: Self-pay

## 2022-03-15 ENCOUNTER — Other Ambulatory Visit (HOSPITAL_COMMUNITY): Payer: Self-pay

## 2022-03-16 ENCOUNTER — Other Ambulatory Visit (HOSPITAL_COMMUNITY): Payer: Self-pay

## 2022-03-19 ENCOUNTER — Ambulatory Visit (HOSPITAL_COMMUNITY): Payer: No Typology Code available for payment source | Admitting: Licensed Clinical Social Worker

## 2022-03-20 NOTE — Progress Notes (Signed)
Sent message, via epic in basket, requesting order in epic from Psychologist, sport and exercise.    03/20/22 1024  Preop Orders  Has preop orders? No  Name of staff/physician contacted for orders(Indicate phone or IB message) L. Kinsinger, MD.

## 2022-03-25 ENCOUNTER — Encounter: Payer: No Typology Code available for payment source | Attending: Family Medicine | Admitting: Skilled Nursing Facility1

## 2022-03-25 ENCOUNTER — Encounter: Payer: Self-pay | Admitting: Skilled Nursing Facility1

## 2022-03-25 DIAGNOSIS — K219 Gastro-esophageal reflux disease without esophagitis: Secondary | ICD-10-CM | POA: Insufficient documentation

## 2022-03-25 DIAGNOSIS — Z713 Dietary counseling and surveillance: Secondary | ICD-10-CM | POA: Insufficient documentation

## 2022-03-25 DIAGNOSIS — E669 Obesity, unspecified: Secondary | ICD-10-CM

## 2022-03-25 DIAGNOSIS — E569 Vitamin deficiency, unspecified: Secondary | ICD-10-CM | POA: Diagnosis not present

## 2022-03-25 NOTE — Progress Notes (Signed)
Pre-Operative Nutrition Class:    Patient was seen on 03/25/2022 for Pre-Operative Bariatric Surgery Education at [facility]    Surgery date: 04/08/2022 Surgery type: sleeve Start weight at [facility]: 290.1 Weight today: 291.8  Samples given per MNT protocol. Patient educated on appropriate usage: Bariatric Advantage Multivitamin Lot # [id number] Exp: 08/24   Bariatric Advantage Calcium  Lot # [id number] Exp: 04/23/2022   Protein Shake Ensure Max Choc Lot # [id number] Exp: 03 Jun 2022   Protein Shake Ensure Max Lucianne Lei Lot # [id number] Exp: 03 May 2022  The following the learning objectives were met by the patient during this course: Identify Pre-Op Dietary Goals and will begin 2 weeks pre-operatively Identify appropriate sources of fluids and proteins  State protein recommendations and appropriate sources pre and post-operatively Identify Post-Operative Dietary Goals and will follow for 2 weeks post-operatively Identify appropriate multivitamin and calcium sources Describe the need for physical activity post-operatively and will follow MD recommendations State when to call healthcare provider regarding medication questions or post-operative complications When having a diagnosis of diabetes understanding hypoglycemia symptoms and the inclusion of 1 complex carbohydrate per meal  Handouts given during class include: Pre-Op Bariatric Surgery Diet Handout Protein Shake Handout Post-Op Bariatric Surgery Nutrition Handout BELT Program Information Flyer Support Group Information Flyer [facility] Bariatric Supplements Price List  Follow-Up Plan: Patient will follow-up at [facility] 2 weeks post operatively for diet advancement per MD.

## 2022-03-26 NOTE — Progress Notes (Signed)
COVID Vaccine received:  '[]'$  No '[x]'$  Yes Date of any COVID positive Test in last 90 days:  PCP - Sallee Lange, MD  Cardiologist -   Chest x-ray - 01-24-22  Epic EKG -  01-24-2022  Epic Stress Test -  ECHO -  Cardiac Cath -   Pacemaker/ICD device     '[]'$  N/A Spinal Cord Stimulator:'[]'$  No '[]'$  Yes      (Remind patient to bring remote DOS) Other Implants:   Bowel Prep -  NO ORDERS YET  History of Sleep Apnea? '[]'$  No '[]'$  Yes   Sleep Study Date:   CPAP used?- '[]'$  No '[]'$  Yes  (Instruct to bring their mask & Tubing)  Does the patient monitor blood sugar? '[]'$  No '[]'$  Yes  '[]'$  N/A Does patient have a [employer] or Dexacom? '[]'$  No '[]'$  Yes   Fasting Blood Sugar Ranges-  Checks Blood Sugar _____ times a day  Blood Thinner Instructions: Aspirin Instructions: Last Dose:  ERAS Protocol Ordered: '[]'$  No  '[]'$  Yes PRE-SURGERY '[]'$  ENSURE  '[]'$  G2   Comments:   Activity level: Patient can / can not climb a flight of stairs without difficulty;  '[]'$  No CP  '[]'$  No SOB,  but would have ______   Patient can / can not perform ADLs without assistance.   Anesthesia review: HTN  Patient denies shortness of breath, fever, cough and chest pain at PAT appointment.  Patient verbalized understanding and agreement to the Pre-Surgical Instructions that were given to them at this PAT appointment. Patient was also educated of the need to review these PAT instructions again prior to his/her surgery.I reviewed the appropriate phone numbers to call if they have any and questions or concerns.

## 2022-03-26 NOTE — Patient Instructions (Signed)
SURGICAL WAITING ROOM VISITATION Patients having surgery or a procedure may have no more than 2 support people in the waiting area - these visitors may rotate in the visitor waiting room.   Children under the age of 35 must have an adult with them who is not the patient. If the patient needs to stay at the hospital during part of their recovery, the visitor guidelines for inpatient rooms apply.  PRE-OP VISITATION  Pre-op nurse will coordinate an appropriate time for 1 support person to accompany the patient in pre-op.  This support person may not rotate.  This visitor will be contacted when the time is appropriate for the visitor to come back in the pre-op area.  Please refer to the [facility] website for the visitor guidelines for Inpatients (after your surgery is over and you are in a regular room).  You are not required to quarantine at this time prior to your surgery. However, you must do this: Hand Hygiene often Do NOT share personal items Notify your provider if you are in close contact with someone who has COVID or you develop fever 100.4 or greater, new onset of sneezing, cough, sore throat, shortness of breath or body aches.  If you test positive for Covid or have been in contact with anyone that has tested positive in the last 10 days please notify you surgeon.    Your procedure is scheduled on:  Monday April 08, 2022  Report to [facility] Main Entrance: [place] entrance where the [employer] is available.   Report to admitting at: 05:15 AM  +++++Call this number if you have any questions or problems the morning of surgery [phone]  Do not eat or drink anything after Midnight the night prior to your surgery/procedure.   FOLLOW BOWEL PREP AND ANY ADDITIONAL PRE OP INSTRUCTIONS YOU RECEIVED FROM YOUR SURGEON'S OFFICE!!!   Oral Hygiene is also important to reduce your risk of infection.        Remember - BRUSH YOUR TEETH THE MORNING OF SURGERY WITH YOUR  REGULAR TOOTHPASTE   Take ONLY these medicines the morning of surgery with A SIP OF WATER: sertraline, Omeprazole, and you may use Flonase if needed.                    You may not have any metal on your body including hair pins, jewelry, and body piercing  Do not wear make-up, lotions, powders, perfumes or deodorant  Do not wear nail polish including gel and S&S, artificial / acrylic nails, or any other type of covering on natural nails including finger and toenails. If you have artificial nails, gel coating, etc., that needs to be removed by a nail salon, Please have this removed prior to surgery. Not doing so may mean that your surgery could be cancelled or delayed if the Surgeon or anesthesia staff feels like they are unable to monitor you safely.   Do not shave 48 hours prior to surgery to avoid nicks in your skin which may contribute to postoperative infections.    Contacts, Hearing Aids, dentures or bridgework may not be worn into surgery.   You may bring a small overnight bag with you on the day of surgery, only pack items that are not valuable .[facility] IS NOT RESPONSIBLE   FOR VALUABLES THAT ARE LOST OR STOLEN.   Do not bring your home medications to the hospital. The Pharmacy will dispense medications listed on your medication list to you during  your admission in the Hospital.   Please read over the following fact sheets you were given: IF YOU HAVE QUESTIONS ABOUT YOUR PRE-OP INSTRUCTIONS, PLEASE CALL [phone]  ([place])   [place] - Preparing for Surgery Before surgery, you can play an important role.  Because skin is not sterile, your skin needs to be as free of germs as possible.  You can reduce the number of germs on your skin by washing with CHG (chlorahexidine gluconate) soap before surgery.  CHG is an antiseptic cleaner which kills germs and bonds with the skin to continue killing germs even after washing. Please DO NOT use if you have an allergy to CHG or  antibacterial soaps.  If your skin becomes reddened/irritated stop using the CHG and inform your nurse when you arrive at Short Stay. Do not shave (including legs and underarms) for at least 48 hours prior to the first CHG shower.  You may shave your face/neck.  Please follow these instructions carefully:  1.  Shower with CHG Soap the night before surgery and the  morning of surgery.  2.  If you choose to wash your hair, wash your hair first as usual with your normal  shampoo.  3.  After you shampoo, rinse your hair and body thoroughly to remove the shampoo.                             4.  Use CHG as you would any other liquid soap.  You can apply chg directly to the skin and wash.  Gently with a scrungie or clean washcloth.  5.  Apply the CHG Soap to your body ONLY FROM THE NECK DOWN.   Do not use on face/ open                           Wound or open sores. Avoid contact with eyes, ears mouth and genitals (private parts).                       Wash face,  Genitals (private parts) with your normal soap.             6.  Wash thoroughly, paying special attention to the area where your  surgery  will be performed.  7.  Thoroughly rinse your body with warm water from the neck down.  8.  DO NOT shower/wash with your normal soap after using and rinsing off the CHG Soap.            9.  Pat yourself dry with a clean towel.            10.  Wear clean pajamas.            11.  Place clean sheets on your bed the night of your first shower and do not  sleep with pets.  ON THE DAY OF SURGERY : Do not apply any lotions/deodorants the morning of surgery.  Please wear clean clothes to the hospital/surgery center.    FAILURE TO FOLLOW THESE INSTRUCTIONS MAY RESULT IN THE CANCELLATION OF YOUR SURGERY  PATIENT SIGNATURE_________________________________  NURSE SIGNATURE__________________________________  ________________________________________________________________________

## 2022-03-27 ENCOUNTER — Other Ambulatory Visit: Payer: Self-pay

## 2022-03-27 ENCOUNTER — Encounter (HOSPITAL_COMMUNITY): Payer: Self-pay

## 2022-03-27 ENCOUNTER — Encounter (HOSPITAL_COMMUNITY)
Admission: RE | Admit: 2022-03-27 | Discharge: 2022-03-27 | Disposition: A | Discharge status: Home / Self Care | Payer: No Typology Code available for payment source | Source: Ambulatory Visit | Attending: General Surgery | Admitting: General Surgery

## 2022-03-27 VITALS — BP 140/88 | HR 80 | Temp 98.5°F | Resp 22 | Ht 64.0 in | Wt 291.0 lb

## 2022-03-27 DIAGNOSIS — I10 Essential (primary) hypertension: Secondary | ICD-10-CM | POA: Diagnosis not present

## 2022-03-27 DIAGNOSIS — Z01818 Encounter for other preprocedural examination: Secondary | ICD-10-CM | POA: Insufficient documentation

## 2022-03-27 HISTORY — DX: Unspecified osteoarthritis, unspecified site: M19.90

## 2022-03-27 LAB — BASIC METABOLIC PANEL
Anion gap: 10 (ref 5–15)
BUN: 15 mg/dL (ref 6–20)
CO2: 24 mmol/L (ref 22–32)
Calcium: 9.6 mg/dL (ref 8.9–10.3)
Chloride: 104 mmol/L (ref 98–111)
Creatinine, Ser: 0.7 mg/dL (ref 0.44–1.00)
GFR, Estimated: >60 mL/min (ref >60)
Glucose, Bld: 100 mg/dL — ABNORMAL HIGH (ref 70–99)
Potassium: 4.5 mmol/L (ref 3.5–5.1)
Sodium: 138 mmol/L (ref 135–145)

## 2022-03-27 LAB — CBC
HCT: 42.4 % (ref 36.0–46.0)
Hemoglobin: 13.7 g/dL (ref 12.0–15.0)
MCH: 26.3 pg (ref 26.0–34.0)
MCHC: 32.3 g/dL (ref 30.0–36.0)
MCV: 81.4 fL (ref 80.0–100.0)
Platelets: 327 10*3/uL (ref 150–400)
RBC: 5.21 MIL/uL — ABNORMAL HIGH (ref 3.87–5.11)
RDW: 13.7 % (ref 11.5–15.5)
WBC: 9.3 10*3/uL (ref 4.0–10.5)
nRBC: 0 % (ref 0.0–0.2)

## 2022-03-28 ENCOUNTER — Ambulatory Visit: Payer: Self-pay | Admitting: General Surgery

## 2022-03-28 DIAGNOSIS — I10 Essential (primary) hypertension: Secondary | ICD-10-CM

## 2022-04-01 ENCOUNTER — Other Ambulatory Visit (HOSPITAL_COMMUNITY): Payer: Self-pay

## 2022-04-02 ENCOUNTER — Other Ambulatory Visit (HOSPITAL_COMMUNITY): Payer: Self-pay

## 2022-04-08 ENCOUNTER — Encounter (HOSPITAL_COMMUNITY): Payer: Self-pay | Admitting: General Surgery

## 2022-04-08 ENCOUNTER — Encounter (HOSPITAL_COMMUNITY)
Admission: RE | Disposition: A | Discharge status: Home / Self Care | Payer: Self-pay | Source: Home / Self Care | Attending: General Surgery

## 2022-04-08 ENCOUNTER — Inpatient Hospital Stay (HOSPITAL_COMMUNITY): Payer: No Typology Code available for payment source | Admitting: Anesthesiology

## 2022-04-08 ENCOUNTER — Other Ambulatory Visit: Payer: Self-pay

## 2022-04-08 ENCOUNTER — Inpatient Hospital Stay (HOSPITAL_COMMUNITY)
Admission: RE | Admit: 2022-04-08 | Discharge: 2022-04-09 | DRG: 621 | Disposition: A | Discharge status: Home / Self Care | Payer: No Typology Code available for payment source | Attending: General Surgery | Admitting: General Surgery

## 2022-04-08 DIAGNOSIS — K449 Diaphragmatic hernia without obstruction or gangrene: Secondary | ICD-10-CM | POA: Diagnosis present

## 2022-04-08 DIAGNOSIS — Z6841 Body Mass Index (BMI) 40.0 and over, adult: Secondary | ICD-10-CM | POA: Diagnosis not present

## 2022-04-08 DIAGNOSIS — F419 Anxiety disorder, unspecified: Secondary | ICD-10-CM | POA: Diagnosis present

## 2022-04-08 DIAGNOSIS — Z880 Allergy status to penicillin: Secondary | ICD-10-CM | POA: Diagnosis not present

## 2022-04-08 DIAGNOSIS — M1990 Unspecified osteoarthritis, unspecified site: Secondary | ICD-10-CM | POA: Diagnosis present

## 2022-04-08 DIAGNOSIS — F418 Other specified anxiety disorders: Secondary | ICD-10-CM | POA: Diagnosis not present

## 2022-04-08 DIAGNOSIS — K219 Gastro-esophageal reflux disease without esophagitis: Secondary | ICD-10-CM | POA: Diagnosis present

## 2022-04-08 DIAGNOSIS — F32A Depression, unspecified: Secondary | ICD-10-CM | POA: Diagnosis present

## 2022-04-08 DIAGNOSIS — I10 Essential (primary) hypertension: Secondary | ICD-10-CM | POA: Diagnosis present

## 2022-04-08 DIAGNOSIS — Z01818 Encounter for other preprocedural examination: Principal | ICD-10-CM

## 2022-04-08 DIAGNOSIS — E569 Vitamin deficiency, unspecified: Secondary | ICD-10-CM | POA: Diagnosis present

## 2022-04-08 DIAGNOSIS — Z79899 Other long term (current) drug therapy: Secondary | ICD-10-CM

## 2022-04-08 HISTORY — PX: HIATAL HERNIA REPAIR: SHX195

## 2022-04-08 HISTORY — PX: UPPER GI ENDOSCOPY: SHX6162

## 2022-04-08 LAB — COMPREHENSIVE METABOLIC PANEL
ALT: 16 U/L (ref 0–44)
AST: 17 U/L (ref 15–41)
Albumin: 4.3 g/dL (ref 3.5–5.0)
Alkaline Phosphatase: 56 U/L (ref 38–126)
Anion gap: 7 (ref 5–15)
BUN: 13 mg/dL (ref 6–20)
CO2: 24 mmol/L (ref 22–32)
Calcium: 9.4 mg/dL (ref 8.9–10.3)
Chloride: 107 mmol/L (ref 98–111)
Creatinine, Ser: 0.6 mg/dL (ref 0.44–1.00)
GFR, Estimated: >60 mL/min (ref >60)
Glucose, Bld: 112 mg/dL — ABNORMAL HIGH (ref 70–99)
Potassium: 3.6 mmol/L (ref 3.5–5.1)
Sodium: 138 mmol/L (ref 135–145)
Total Bilirubin: 0.7 mg/dL (ref 0.3–1.2)
Total Protein: 7.9 g/dL (ref 6.5–8.1)

## 2022-04-08 LAB — CREATININE, SERUM
Creatinine, Ser: 0.72 mg/dL (ref 0.44–1.00)
GFR, Estimated: >60 mL/min (ref >60)

## 2022-04-08 LAB — TYPE AND SCREEN
ABO/RH(D): A POS
Antibody Screen: NEGATIVE

## 2022-04-08 LAB — CBC WITH DIFFERENTIAL/PLATELET
Abs Immature Granulocytes: 0.02 10*3/uL (ref 0.00–0.07)
Basophils Absolute: 0 10*3/uL (ref 0.0–0.1)
Basophils Relative: 1 %
Eosinophils Absolute: 0.2 10*3/uL (ref 0.0–0.5)
Eosinophils Relative: 3 %
HCT: 40.8 % (ref 36.0–46.0)
Hemoglobin: 13.2 g/dL (ref 12.0–15.0)
Immature Granulocytes: 0 %
Lymphocytes Relative: 27 %
Lymphs Abs: 2.2 10*3/uL (ref 0.7–4.0)
MCH: 26.5 pg (ref 26.0–34.0)
MCHC: 32.4 g/dL (ref 30.0–36.0)
MCV: 81.9 fL (ref 80.0–100.0)
Monocytes Absolute: 0.5 10*3/uL (ref 0.1–1.0)
Monocytes Relative: 6 %
Neutro Abs: 5.2 10*3/uL (ref 1.7–7.7)
Neutrophils Relative %: 63 %
Platelets: 282 10*3/uL (ref 150–400)
RBC: 4.98 MIL/uL (ref 3.87–5.11)
RDW: 14 % (ref 11.5–15.5)
WBC: 8.2 10*3/uL (ref 4.0–10.5)
nRBC: 0 % (ref 0.0–0.2)

## 2022-04-08 LAB — CBC
HCT: 38.4 % (ref 36.0–46.0)
Hemoglobin: 12.6 g/dL (ref 12.0–15.0)
MCH: 26.9 pg (ref 26.0–34.0)
MCHC: 32.8 g/dL (ref 30.0–36.0)
MCV: 81.9 fL (ref 80.0–100.0)
Platelets: 280 10*3/uL (ref 150–400)
RBC: 4.69 MIL/uL (ref 3.87–5.11)
RDW: 13.9 % (ref 11.5–15.5)
WBC: 13.1 10*3/uL — ABNORMAL HIGH (ref 4.0–10.5)
nRBC: 0 % (ref 0.0–0.2)

## 2022-04-08 LAB — POCT PREGNANCY, URINE: Preg Test, Ur: NEGATIVE

## 2022-04-08 SURGERY — GASTRECTOMY, SLEEVE, ROBOT-ASSISTED
Anesthesia: General

## 2022-04-08 MED ORDER — EPHEDRINE SULFATE-NACL 50-0.9 MG/10ML-% IV SOSY
PREFILLED_SYRINGE | INTRAVENOUS | Status: DC | PRN
  Administered 2022-04-08: 10 mg via INTRAVENOUS

## 2022-04-08 MED ORDER — MIDAZOLAM HCL 5 MG/5ML IJ SOLN
INTRAMUSCULAR | Status: DC | PRN
  Administered 2022-04-08: 2 mg via INTRAVENOUS

## 2022-04-08 MED ORDER — MECLIZINE HCL 25 MG PO TABS: 25.0000 mg | ORAL_TABLET | Freq: Three times a day (TID) | ORAL | Status: DC | PRN

## 2022-04-08 MED ORDER — STERILE WATER FOR IRRIGATION IR SOLN
Status: DC | PRN
  Administered 2022-04-08: 1000 mL
  Administered 2022-04-08: 500 mL

## 2022-04-08 MED ORDER — BUPIVACAINE LIPOSOME 1.3 % IJ SUSP
INTRAMUSCULAR | Status: AC
  Filled 2022-04-08: qty 20

## 2022-04-08 MED ORDER — HYDROMORPHONE HCL 1 MG/ML IJ SOLN
INTRAMUSCULAR | Status: AC
  Filled 2022-04-08: qty 2

## 2022-04-08 MED ORDER — OXYCODONE HCL 5 MG/5ML PO SOLN
5.0000 mg | Freq: Four times a day (QID) | ORAL | Status: DC | PRN
  Administered 2022-04-08 – 2022-04-09 (×2): 5 mg via ORAL
  Filled 2022-04-08 (×2): qty 5

## 2022-04-08 MED ORDER — HYDRALAZINE HCL 20 MG/ML IJ SOLN: 10.0000 mg | INTRAMUSCULAR | Status: DC | PRN

## 2022-04-08 MED ORDER — ONDANSETRON HCL 4 MG/2ML IJ SOLN: 4.0000 mg | INTRAMUSCULAR | Status: DC | PRN

## 2022-04-08 MED ORDER — SUGAMMADEX SODIUM 500 MG/5ML IV SOLN
INTRAVENOUS | Status: DC | PRN
  Administered 2022-04-08: 100 mg via INTRAVENOUS
  Administered 2022-04-08: 20 mg via INTRAVENOUS

## 2022-04-08 MED ORDER — ROCURONIUM BROMIDE 10 MG/ML (PF) SYRINGE
PREFILLED_SYRINGE | INTRAVENOUS | Status: DC | PRN
  Administered 2022-04-08: 10 mg via INTRAVENOUS
  Administered 2022-04-08: 100 mg via INTRAVENOUS

## 2022-04-08 MED ORDER — ACETAMINOPHEN 500 MG PO TABS
1000.0000 mg | ORAL_TABLET | Freq: Three times a day (TID) | ORAL | Status: DC
  Administered 2022-04-08 – 2022-04-09 (×3): 1000 mg via ORAL
  Filled 2022-04-08 (×3): qty 2

## 2022-04-08 MED ORDER — BUPIVACAINE LIPOSOME 1.3 % IJ SUSP
INTRAMUSCULAR | Status: DC | PRN
  Administered 2022-04-08: 20 mL

## 2022-04-08 MED ORDER — PHENYLEPHRINE 80 MCG/ML (10ML) SYRINGE FOR IV PUSH (FOR BLOOD PRESSURE SUPPORT)
PREFILLED_SYRINGE | INTRAVENOUS | Status: DC | PRN
  Administered 2022-04-08 (×4): 160 ug via INTRAVENOUS

## 2022-04-08 MED ORDER — FENTANYL CITRATE (PF) 100 MCG/2ML IJ SOLN
INTRAMUSCULAR | Status: AC
  Filled 2022-04-08: qty 2

## 2022-04-08 MED ORDER — OXYCODONE HCL 5 MG/5ML PO SOLN: 5.0000 mg | Freq: Once | ORAL | Status: DC | PRN

## 2022-04-08 MED ORDER — PRAMIPEXOLE DIHYDROCHLORIDE 0.25 MG PO TABS
1.0000 mg | ORAL_TABLET | Freq: Every day | ORAL | Status: DC
  Administered 2022-04-08: 1 mg via ORAL
  Filled 2022-04-08: qty 4

## 2022-04-08 MED ORDER — 0.9 % SODIUM CHLORIDE (POUR BTL) OPTIME
TOPICAL | Status: DC | PRN
  Administered 2022-04-08: 1000 mL

## 2022-04-08 MED ORDER — HEPARIN SODIUM (PORCINE) 5000 UNIT/ML IJ SOLN
5000.0000 [IU] | Freq: Three times a day (TID) | INTRAMUSCULAR | Status: DC
  Administered 2022-04-08 – 2022-04-09 (×2): 5000 [IU] via SUBCUTANEOUS
  Filled 2022-04-08 (×2): qty 1

## 2022-04-08 MED ORDER — HYDROMORPHONE HCL 1 MG/ML IJ SOLN
0.2500 mg | INTRAMUSCULAR | Status: DC | PRN
  Administered 2022-04-08 (×2): 0.5 mg via INTRAVENOUS

## 2022-04-08 MED ORDER — LIDOCAINE HCL 2 % IJ SOLN
INTRAMUSCULAR | Status: AC
  Filled 2022-04-08: qty 20

## 2022-04-08 MED ORDER — LIDOCAINE 2% (20 MG/ML) 5 ML SYRINGE
INTRAMUSCULAR | Status: DC | PRN
  Administered 2022-04-08: 60 mg via INTRAVENOUS

## 2022-04-08 MED ORDER — BUPIVACAINE-EPINEPHRINE 0.25% -1:200000 IJ SOLN
INTRAMUSCULAR | Status: DC | PRN
  Administered 2022-04-08: 30 mL

## 2022-04-08 MED ORDER — APREPITANT 40 MG PO CAPS
40.0000 mg | ORAL_CAPSULE | ORAL | Status: AC
  Administered 2022-04-08: 40 mg via ORAL
  Filled 2022-04-08: qty 1

## 2022-04-08 MED ORDER — KETAMINE HCL 10 MG/ML IJ SOLN
INTRAMUSCULAR | Status: AC
  Filled 2022-04-08: qty 1

## 2022-04-08 MED ORDER — ONDANSETRON HCL 4 MG/2ML IJ SOLN
INTRAMUSCULAR | Status: DC | PRN
  Administered 2022-04-08: 4 mg via INTRAVENOUS

## 2022-04-08 MED ORDER — HEPARIN SODIUM (PORCINE) 5000 UNIT/ML IJ SOLN
5000.0000 [IU] | INTRAMUSCULAR | Status: AC
  Administered 2022-04-08: 5000 [IU] via SUBCUTANEOUS
  Filled 2022-04-08: qty 1

## 2022-04-08 MED ORDER — CHLORHEXIDINE GLUCONATE CLOTH 2 % EX PADS: 6.0000 | MEDICATED_PAD | Freq: Once | CUTANEOUS | Status: DC

## 2022-04-08 MED ORDER — LIDOCAINE 20MG/ML (2%) 15 ML SYRINGE OPTIME
INTRAMUSCULAR | Status: DC | PRN
  Administered 2022-04-08: 1.5 mg/kg/h via INTRAVENOUS

## 2022-04-08 MED ORDER — DEXAMETHASONE SODIUM PHOSPHATE 4 MG/ML IJ SOLN
4.0000 mg | INTRAMUSCULAR | Status: AC
  Administered 2022-04-08: 10 mg via INTRAVENOUS

## 2022-04-08 MED ORDER — MIDAZOLAM HCL 2 MG/2ML IJ SOLN
INTRAMUSCULAR | Status: AC
  Filled 2022-04-08: qty 2

## 2022-04-08 MED ORDER — EPHEDRINE 5 MG/ML INJ
INTRAVENOUS | Status: AC
  Filled 2022-04-08: qty 5

## 2022-04-08 MED ORDER — ACETAMINOPHEN 160 MG/5ML PO SOLN: 1000.0000 mg | Freq: Three times a day (TID) | ORAL | Status: DC

## 2022-04-08 MED ORDER — AMISULPRIDE (ANTIEMETIC) 5 MG/2ML IV SOLN: 10.0000 mg | Freq: Once | INTRAVENOUS | Status: DC | PRN

## 2022-04-08 MED ORDER — LACTATED RINGERS IR SOLN
Status: DC | PRN
  Administered 2022-04-08: 1000 mL

## 2022-04-08 MED ORDER — ACETAMINOPHEN 500 MG PO TABS
1000.0000 mg | ORAL_TABLET | ORAL | Status: AC
  Administered 2022-04-08: 1000 mg via ORAL
  Filled 2022-04-08: qty 2

## 2022-04-08 MED ORDER — ONDANSETRON HCL 4 MG/2ML IJ SOLN
INTRAMUSCULAR | Status: AC
  Filled 2022-04-08: qty 2

## 2022-04-08 MED ORDER — DEXAMETHASONE SODIUM PHOSPHATE 10 MG/ML IJ SOLN
INTRAMUSCULAR | Status: AC
  Filled 2022-04-08: qty 1

## 2022-04-08 MED ORDER — BUPIVACAINE-EPINEPHRINE (PF) 0.25% -1:200000 IJ SOLN
INTRAMUSCULAR | Status: AC
  Filled 2022-04-08: qty 30

## 2022-04-08 MED ORDER — PHENYLEPHRINE 80 MCG/ML (10ML) SYRINGE FOR IV PUSH (FOR BLOOD PRESSURE SUPPORT)
PREFILLED_SYRINGE | INTRAVENOUS | Status: AC
  Filled 2022-04-08: qty 10

## 2022-04-08 MED ORDER — FENTANYL CITRATE (PF) 100 MCG/2ML IJ SOLN
INTRAMUSCULAR | Status: DC | PRN
  Administered 2022-04-08: 100 ug via INTRAVENOUS

## 2022-04-08 MED ORDER — ORAL CARE MOUTH RINSE: 15.0000 mL | Freq: Once | OROMUCOSAL | Status: AC

## 2022-04-08 MED ORDER — SODIUM CHLORIDE 0.9 % IV SOLN
2.0000 g | INTRAVENOUS | Status: AC
  Administered 2022-04-08: 2 g via INTRAVENOUS
  Filled 2022-04-08: qty 2

## 2022-04-08 MED ORDER — ONDANSETRON HCL 4 MG/2ML IJ SOLN
4.0000 mg | Freq: Once | INTRAMUSCULAR | Status: AC | PRN
  Administered 2022-04-08: 4 mg via INTRAVENOUS

## 2022-04-08 MED ORDER — CHLORHEXIDINE GLUCONATE 0.12 % MT SOLN
15.0000 mL | Freq: Once | OROMUCOSAL | Status: AC
Start: 2022-04-08 — End: 2022-04-08
  Administered 2022-04-08: 15 mL via OROMUCOSAL

## 2022-04-08 MED ORDER — KETAMINE HCL 10 MG/ML IJ SOLN
INTRAMUSCULAR | Status: DC | PRN
  Administered 2022-04-08 (×3): 10 mg via INTRAVENOUS
  Administered 2022-04-08: 20 mg via INTRAVENOUS

## 2022-04-08 MED ORDER — ROCURONIUM BROMIDE 10 MG/ML (PF) SYRINGE
PREFILLED_SYRINGE | INTRAVENOUS | Status: AC
  Filled 2022-04-08: qty 10

## 2022-04-08 MED ORDER — SIMETHICONE 80 MG PO CHEW
80.0000 mg | CHEWABLE_TABLET | Freq: Four times a day (QID) | ORAL | Status: DC | PRN
  Administered 2022-04-08 – 2022-04-09 (×3): 80 mg via ORAL
  Filled 2022-04-08 (×3): qty 1

## 2022-04-08 MED ORDER — AMISULPRIDE (ANTIEMETIC) 5 MG/2ML IV SOLN
INTRAVENOUS | Status: AC
  Filled 2022-04-08: qty 4

## 2022-04-08 MED ORDER — PROPOFOL 10 MG/ML IV BOLUS
INTRAVENOUS | Status: DC | PRN
  Administered 2022-04-08: 200 mg via INTRAVENOUS

## 2022-04-08 MED ORDER — ENSURE MAX PROTEIN PO LIQD
2.0000 [oz_av] | ORAL | Status: DC
  Administered 2022-04-09 (×5): 2 [oz_av] via ORAL

## 2022-04-08 MED ORDER — FAMOTIDINE IN NACL 20-0.9 MG/50ML-% IV SOLN
20.0000 mg | Freq: Two times a day (BID) | INTRAVENOUS | Status: DC
  Administered 2022-04-08 – 2022-04-09 (×3): 20 mg via INTRAVENOUS
  Filled 2022-04-08 (×3): qty 50

## 2022-04-08 MED ORDER — SERTRALINE HCL 50 MG PO TABS
75.0000 mg | ORAL_TABLET | Freq: Every day | ORAL | Status: DC
  Administered 2022-04-09: 75 mg via ORAL
  Filled 2022-04-08: qty 1

## 2022-04-08 MED ORDER — MORPHINE SULFATE (PF) 2 MG/ML IV SOLN: 1.0000 mg | INTRAVENOUS | Status: DC | PRN

## 2022-04-08 MED ORDER — BUPIVACAINE LIPOSOME 1.3 % IJ SUSP: 20.0000 mL | Freq: Once | INTRAMUSCULAR | Status: DC

## 2022-04-08 MED ORDER — SCOPOLAMINE 1 MG/3DAYS TD PT72
1.0000 | MEDICATED_PATCH | TRANSDERMAL | Status: DC
  Administered 2022-04-08: 1.5 mg via TRANSDERMAL
  Filled 2022-04-08: qty 1

## 2022-04-08 MED ORDER — PROPOFOL 10 MG/ML IV BOLUS
INTRAVENOUS | Status: AC
  Filled 2022-04-08: qty 20

## 2022-04-08 MED ORDER — DEXTROSE-NACL 5-0.45 % IV SOLN: INTRAVENOUS | Status: DC

## 2022-04-08 MED ORDER — LORATADINE 10 MG PO TABS
10.0000 mg | ORAL_TABLET | Freq: Every day | ORAL | Status: DC
  Administered 2022-04-09: 10 mg via ORAL
  Filled 2022-04-08: qty 1

## 2022-04-08 MED ORDER — OXYCODONE HCL 5 MG PO TABS: 5.0000 mg | ORAL_TABLET | Freq: Once | ORAL | Status: DC | PRN

## 2022-04-08 MED ORDER — LACTATED RINGERS IV SOLN
INTRAVENOUS | Status: DC
  Administered 2022-04-08: 1000 mL via INTRAVENOUS

## 2022-04-08 SURGICAL SUPPLY — 87 items
ADH SKN CLS APL DERMABOND .7 (GAUZE/BANDAGES/DRESSINGS)
APL PRP STRL LF DISP 70% ISPRP (MISCELLANEOUS) ×1
APL SKNCLS STERI-STRIP NONHPOA (GAUZE/BANDAGES/DRESSINGS) ×1
APPLIER CLIP 5 13 M/L LIGAMAX5 (MISCELLANEOUS)
APPLIER CLIP ROT 10 11.4 M/L (STAPLE)
APR CLP MED LRG 11.4X10 (STAPLE)
APR CLP MED LRG 5 ANG JAW (MISCELLANEOUS)
BAG COUNTER SPONGE SURGICOUNT (BAG) ×1 IMPLANT
BAG LAPAROSCOPIC 12 15 PORT 16 (BASKET) IMPLANT
BAG RETRIEVAL 12/15 (BASKET)
BAG SPEC RTRVL 10 TROC 200 (ENDOMECHANICALS)
BAG SPNG CNTER NS LX DISP (BAG) ×1
BENZOIN TINCTURE PRP APPL 2/3 (GAUZE/BANDAGES/DRESSINGS) ×1 IMPLANT
BLADE SURG SZ11 CARB STEEL (BLADE) ×1 IMPLANT
BNDG ADH 1X3 SHEER STRL LF (GAUZE/BANDAGES/DRESSINGS) ×6 IMPLANT
BNDG ADH THN 3X1 STRL LF (GAUZE/BANDAGES/DRESSINGS) ×6
CANNULA REDUC XI 12-8 STAPL (CANNULA) ×1
CANNULA REDUCER 12-8 DVNC XI (CANNULA) ×1 IMPLANT
CHLORAPREP W/TINT 26 (MISCELLANEOUS) ×1 IMPLANT
CLIP APPLIE 5 13 M/L LIGAMAX5 (MISCELLANEOUS) IMPLANT
CLIP APPLIE ROT 10 11.4 M/L (STAPLE) IMPLANT
COVER SURGICAL LIGHT HANDLE (MISCELLANEOUS) ×1 IMPLANT
DERMABOND ADVANCED .7 DNX12 (GAUZE/BANDAGES/DRESSINGS) IMPLANT
DRAPE ARM DVNC X/XI (DISPOSABLE) ×4 IMPLANT
DRAPE COLUMN DVNC XI (DISPOSABLE) ×1 IMPLANT
DRAPE DA VINCI XI ARM (DISPOSABLE) ×4
DRAPE DA VINCI XI COLUMN (DISPOSABLE) ×1
DRAPE ORTHO SPLIT 77X108 STRL (DRAPES) ×1
DRAPE SURG ORHT 6 SPLT 77X108 (DRAPES) ×1 IMPLANT
ELECT REM PT RETURN 15FT ADLT (MISCELLANEOUS) ×1 IMPLANT
GAUZE 4X4 16PLY ~~LOC~~+RFID DBL (SPONGE) ×1 IMPLANT
GLOVE BIOGEL PI IND STRL 7.0 (GLOVE) ×2 IMPLANT
GLOVE SURG SS PI 7.0 STRL IVOR (GLOVE) ×2 IMPLANT
GOWN STRL REUS W/ TWL LRG LVL3 (GOWN DISPOSABLE) ×2 IMPLANT
GOWN STRL REUS W/ TWL XL LVL3 (GOWN DISPOSABLE) IMPLANT
GOWN STRL REUS W/TWL LRG LVL3 (GOWN DISPOSABLE) ×2
GOWN STRL REUS W/TWL XL LVL3 (GOWN DISPOSABLE)
GRASPER SUT TROCAR 14GX15 (MISCELLANEOUS) ×1 IMPLANT
IRRIG SUCT STRYKERFLOW 2 WTIP (MISCELLANEOUS) ×1
IRRIGATION SUCT STRKRFLW 2 WTP (MISCELLANEOUS) ×1 IMPLANT
KIT BASIN OR (CUSTOM PROCEDURE TRAY) ×1 IMPLANT
KIT TURNOVER KIT A (KITS) IMPLANT
MARKER SKIN DUAL TIP RULER LAB (MISCELLANEOUS) ×1 IMPLANT
MAT PREVALON FULL STRYKER (MISCELLANEOUS) ×1 IMPLANT
NDL SPNL 22GX3.5 QUINCKE BK (NEEDLE) ×1 IMPLANT
NEEDLE SPNL 22GX3.5 QUINCKE BK (NEEDLE) ×1 IMPLANT
PACK CARDIOVASCULAR III (CUSTOM PROCEDURE TRAY) ×1 IMPLANT
POUCH RETRIEVAL ECOSAC 10 (ENDOMECHANICALS) IMPLANT
POUCH RETRIEVAL ECOSAC 10MM (ENDOMECHANICALS)
RELOAD STAPLE 60 2.5 WHT DVNC (STAPLE) ×1 IMPLANT
RELOAD STAPLE 60 3.5 BLU DVNC (STAPLE) ×1 IMPLANT
RELOAD STAPLE 60 4.3 GRN DVNC (STAPLE) IMPLANT
RELOAD STAPLER 2.5X60 WHT DVNC (STAPLE) ×5 IMPLANT
RELOAD STAPLER 3.5X60 BLU DVNC (STAPLE) ×2 IMPLANT
RELOAD STAPLER 4.3X60 GRN DVNC (STAPLE) IMPLANT
SCISSORS LAP 5X35 DISP (ENDOMECHANICALS) IMPLANT
SEAL CANN UNIV 5-8 DVNC XI (MISCELLANEOUS) ×3 IMPLANT
SEAL XI 5MM-8MM UNIVERSAL (MISCELLANEOUS) ×3
SEALER VESSEL DA VINCI XI (MISCELLANEOUS) ×1
SEALER VESSEL EXT DVNC XI (MISCELLANEOUS) ×1 IMPLANT
SET TUBE SMOKE EVAC HIGH FLOW (TUBING) ×1 IMPLANT
SLEEVE GASTRECTOMY 40FR VISIGI (MISCELLANEOUS) ×1 IMPLANT
SOL ANTI FOG 6CC (MISCELLANEOUS) ×1 IMPLANT
SOLUTION ANTI FOG 6CC (MISCELLANEOUS) ×1
SOLUTION ELECTROLUBE (MISCELLANEOUS) ×1 IMPLANT
SPIKE FLUID TRANSFER (MISCELLANEOUS) ×1 IMPLANT
STAPLER 60 DA VINCI SURE FORM (STAPLE) ×1
STAPLER 60 SUREFORM DVNC (STAPLE) ×1 IMPLANT
STAPLER CANNULA SEAL DVNC XI (STAPLE) ×1 IMPLANT
STAPLER CANNULA SEAL XI (STAPLE) ×1
STAPLER RELOAD 2.5X60 WHITE (STAPLE) ×5
STAPLER RELOAD 2.5X60 WHT DVNC (STAPLE) ×5
STAPLER RELOAD 3.5X60 BLU DVNC (STAPLE) ×2
STAPLER RELOAD 3.5X60 BLUE (STAPLE) ×2
STAPLER RELOAD 4.3X60 GREEN (STAPLE)
STAPLER RELOAD 4.3X60 GRN DVNC (STAPLE)
STRIP CLOSURE SKIN 1/2X4 (GAUZE/BANDAGES/DRESSINGS) ×1 IMPLANT
SUT ETHIBOND 0 36 GRN (SUTURE) IMPLANT
SUT MNCRL AB 4-0 PS2 18 (SUTURE) ×1 IMPLANT
SUT VIC AB 2-0 SH 27 (SUTURE) ×1
SUT VIC AB 2-0 SH 27X BRD (SUTURE) IMPLANT
SUT VICRYL 0 TIES 12 18 (SUTURE) ×1 IMPLANT
SYR 20ML LL LF (SYRINGE) ×2 IMPLANT
TOWEL OR 17X26 10 PK STRL BLUE (TOWEL DISPOSABLE) ×1 IMPLANT
TOWEL OR NON WOVEN STRL DISP B (DISPOSABLE) ×1 IMPLANT
TRAY FOLEY MTR SLVR 16FR STAT (SET/KITS/TRAYS/PACK) IMPLANT
TROCAR Z-THREAD OPTICAL 5X100M (TROCAR) ×1 IMPLANT

## 2022-04-08 NOTE — Anesthesia Postprocedure Evaluation (Signed)
Anesthesia Post Note  Patient: Elizabeth Foster  Procedure(s) Performed: XI ROBOT ASSISTED GASTRIC SLEEVE RESECTION UPPER GI ENDOSCOPY HERNIA REPAIR HIATAL     Patient location during evaluation: PACU Anesthesia Type: General Level of consciousness: awake and alert, oriented and patient cooperative Pain management: pain level controlled Vital Signs Assessment: post-procedure vital signs reviewed and stable Respiratory status: spontaneous breathing, nonlabored ventilation and respiratory function stable Cardiovascular status: blood pressure returned to baseline and stable Postop Assessment: no apparent nausea or vomiting Anesthetic complications: no   No notable events documented.  Last Vitals:  Vitals:   04/08/22 1100 04/08/22 1115  BP: 105/68 118/67  Pulse: 66 65  Resp: 16 10  Temp:    SpO2: 95% 95%    Last Pain:  Vitals:   04/08/22 1115  TempSrc:   PainSc: [place]

## 2022-04-08 NOTE — Progress Notes (Signed)
PHARMACY CONSULT FOR:  Risk Assessment for Post-Discharge VTE Following Bariatric Surgery  Post-Discharge VTE Risk Assessment: This patient's probability of 30-day post-discharge VTE is increased due to the factors marked: x Sleeve gastrectomy   Liver disorder (transplant, cirrhosis, or nonalcoholic steatohepatitis)   Hx of VTE   Hemorrhage requiring transfusion   GI perforation, leak, or obstruction   ====================================================    Female    Age >/=60 years    BMI >/=50 kg/m2    CHF    Dyspnea at Rest    Paraplegia   x Non-gastric-band surgery    Operation Time >/=3 hr    Return to OR     Length of Stay >/= 3 d   Hypercoagulable condition   Significant venous stasis      Predicted probability of 30-day post-discharge VTE: 0.16%  Other patient-specific factors to consider:   Recommendation for Discharge: No pharmacologic prophylaxis post-discharge   Elizabeth Foster is a 36 y.o. female who underwent robotic Sleeve Gastrectomy, robotic hiatal hernia repair on 04/08/2022   Case start: 0758 Case end: 0941   Allergies  Allergen Reactions   Polytrim [Polymyxin B-Trimethoprim]     Blister on eye and eye was swollen   Amoxicillin Rash    PT CAN TAKE KEFLEX WITH NO PROBLEMS PER PT AND HER PRENATAL RECORD Has patient had a PCN reaction causing immediate rash, facial/tongue/throat swelling, SOB or lightheadedness with hypotension: Yes Has patient had a PCN reaction causing severe rash involving mucus membranes or skin necrosis: No Has patient had a PCN reaction that required hospitalization: No Has patient had a PCN reaction occurring within the last 10 years: No If all of the above answers are "NO", then may proceed with Cephalospori    Patient Measurements: Height: '5\' 4"'$  (162.6 cm) Weight: 128.7 kg (283 lb 12.8 oz) IBW/kg (Calculated) : 54.7 Body mass index is 48.71 kg/m.  Recent Labs    04/08/22 0630  WBC 8.2  HGB 13.2  HCT 40.8  PLT  282  CREATININE 0.60  ALBUMIN 4.3  PROT 7.9  AST 17  ALT 16  ALKPHOS 56  BILITOT 0.7   Estimated Creatinine Clearance: 129.4 mL/min (by C-G formula based on SCr of 0.6 mg/dL).    Past Medical History:  Diagnosis Date   Allergy    Anxiety    Arthritis    Back pain    GERD (gastroesophageal reflux disease)    Hypertension during pregnancy    Knee pain    Lower extremity edema    Obesity    RLS (restless legs syndrome)    Varicose vein of leg      Medications Prior to Admission  Medication Sig Dispense Refill Last Dose   fexofenadine (ALLEGRA) 180 MG tablet Take 180 mg by mouth daily.   04/07/2022   fluticasone (FLONASE) 50 MCG/ACT nasal spray Place 1 spray into both nostrils daily as needed for allergies.   Past Month   Multiple Vitamin (MULTIVITAMIN) capsule Take 1 capsule by mouth daily.   04/07/2022   Omega-3 Fatty Acids (FISH OIL) 1200 MG CAPS Take 1,200 mg by mouth in the morning and at bedtime.   04/07/2022   omeprazole (PRILOSEC) 20 MG capsule Take 1 capsule (20 mg total) by mouth 2 (two) times daily before a meal. 60 capsule 5 04/08/2022 at 0430   pramipexole (MIRAPEX) 1 MG tablet Take 1 tablet (1 mg total) by mouth at bedtime. 90 tablet 0 04/07/2022   sertraline (ZOLOFT) 50 MG tablet Take  1.5 tablets (75 mg total) by mouth daily. 135 tablet 1 04/08/2022 at 0430   VITAMIN D PO Take 5,000 Units by mouth daily.   04/07/2022   acetaminophen (TYLENOL) 325 MG tablet Take 650 mg every 6 (six) hours as needed by mouth for moderate pain.   More than a month   alum & mag hydroxide-simeth (MAALOX/MYLANTA) 200-200-20 MG/5ML suspension Take 20 mLs by mouth every 6 (six) hours as needed for indigestion or heartburn.   More than a month   calcium carbonate (TUMS - DOSED IN MG ELEMENTAL CALCIUM) 500 MG chewable tablet Chew 1 tablet by mouth daily as needed for indigestion or heartburn.   More than a month   doxycycline (VIBRA-TABS) 100 MG tablet Take 1 tablet (100 mg total) by mouth 2 (two)  times daily. (Patient not taking: Reported on 03/25/2022) 14 tablet 0 Not Taking   Insulin Pen Needle (UNIFINE PENTIPS) 32G X 4 MM MISC Use as directed with saxenda daily 100 each 0 supply   meclizine (ANTIVERT) 25 MG tablet Take 1 tablet (25 mg total) by mouth 3 (three) times daily as needed for dizziness. 30 tablet 4 More than a month   WEGOVY 0.25 MG/0.5ML SOAJ Inject 0.25 mg subcutaneously every week for weight loss 2 mL 0 More than a month   WEGOVY 0.5 MG/0.5ML SOAJ Inject 0.5 mg into the skin every week for weight loss (Patient not taking: Reported on 03/25/2022) 2 mL 0 Not Taking    Peggyann Juba, PharmD, BCPS Pharmacy: [phone] 04/08/2022,10:53 AM

## 2022-04-08 NOTE — Transfer of Care (Signed)
Immediate Anesthesia Transfer of Care Note  Patient: Elizabeth Foster  Procedure(s) Performed: XI ROBOT ASSISTED GASTRIC SLEEVE RESECTION UPPER GI ENDOSCOPY HERNIA REPAIR HIATAL  Patient Location: PACU  Anesthesia Type:General  Level of Consciousness: awake, alert , and oriented  Airway & Oxygen Therapy: Patient Spontanous Breathing and Patient connected to face mask oxygen  Post-op Assessment: Report given to RN and Post -op Vital signs reviewed and stable  Post vital signs: Reviewed and stable  Last Vitals:  Vitals Value Taken Time  BP 124/70 04/08/22 0950  Temp 36.9 C 04/08/22 0950  Pulse 80 04/08/22 0951  Resp 19 04/08/22 0951  SpO2 99 % 04/08/22 0951  Vitals shown include unvalidated device data.  Last Pain:  Vitals:   04/08/22 0622  TempSrc:   PainSc: 0-No pain         Complications: No notable events documented.

## 2022-04-08 NOTE — Op Note (Addendum)
Preop Diagnosis: Obesity Class III  Postop Diagnosis: same  Procedure performed: robotic Sleeve Gastrectomy, robotic hiatal hernia repair  Assitant: Louanna Raw  Indications:  The patient is a 36 y.o. year-old morbidly obese female who has been followed in the Bariatric Clinic as an outpatient. This patient was diagnosed with morbid obesity with a BMI of Body mass index is 48.71 kg/m. and significant co-morbidities including hypertension and GERD.  The patient was counseled extensively in the Bariatric Outpatient Clinic and after a thorough explanation of the risks and benefits of surgery (including death from complications, bowel leak, infection such as peritonitis and/or sepsis, internal hernia, bleeding, need for blood transfusion, bowel obstruction, organ failure, pulmonary embolus, deep venous thrombosis, wound infection, incisional hernia, skin breakdown, and others entailed on the consent form) and after a compliant diet and exercise program, the patient was scheduled for an elective laparoscopic sleeve gastrectomy.  Description of Operation:  Following informed consent, the patient was taken to the operating room and placed on the operating table in the supine position.  She had previously received prophylactic antibiotics and subcutaneous heparin for DVT prophylaxis in the pre-op holding area.  After induction of general endotracheal anesthesia by the anesthesiologist, the patient underwent placement of sequential compression devices and an oro-gastric tube.  A timeout was confirmed by the surgery and anesthesia teams.  The patient was adequately padded at all pressure points and placed on a footboard to prevent slippage from the OR table during extremes of position during surgery.  She underwent a routine sterile prep and drape of her entire abdomen.    Next, A transverse incision was made at the left mid abdomen area and a 5m optical viewing trocar was introduced into the peritoneal  cavity. Pneumoperitoneum was applied with a high flow and low pressure. A laparoscope was inserted to confirm placement. A extraperitoneal block was then placed at the lateral abdominal wall using exparel diluted with marcaine. 5 additional incisions were placed: 1 11mtrocar to the right of the midline. 1 additional 36m63mrocar in the left mid abdominal area, 1 36mm33mocar in the left lateral abdomen, 1 5mm 35mcar in the left lower quadrant subcostal area, and a Nathanson retractor was placed through a subxiphoid incision.  The previous endoscopy showed a 5 cm hiatal hernia.  A full 360 dissection of the hiatus was performed by removing the sac away from the crus.  Esophagus was dissected away from the filmy tissue into the chest to ensure enough esophageal length to get into the abdomen.  Next, the posterior crus was sutured closed with interrupted 0 Ethibond stitches.  This closed the hiatus up to about 3 cm in diameter.  Next, a hole was created through the lesser omentum along the greater curve of the stomach to enter the lesser sac. The vessels along the greater omentum were  Then ligated and divided using the Harmonic scalpel moving towards the spleen and then short gastric vessels were ligated and divided in the same fashion to fully mobilize the fundus. The left crus was identified to ensure completion of the dissection. Next the antrum was measured and dissection continued inferiorly along the greater curve towards the pylorus and stopped 6cm from the pylorus.   A 40Fr ViSiGi dilator was placed into the esophgaus and along the lesser curve of the stomach and placed on suction. 2 60mm 99m load robot stapler(s)  followed by 5 60mm w33m load robotic stapler(s)were used to make the resection along the antrum being sure  to stay well away from the angularis by angling the jaws of the stapler towards the greater curve and later completing the resection staying along the [place] and ensuring the fundus was  not retained by appropriately retracting it lateral. Air was inserted through the [place] to perform a leak test showing no bubbles and a neutral lie of the stomach.  The ViSiGi was removed.  A 2-0 Vicryl was used to suture the last 2 staple firings of the sleeve to the omentum in running fashion.  Next I performed upper endoscopy.GEJ junction was identified at  39 cm from the lips. No bubbles were seen and the sleeve and antrum distended appropriately. The specimen was removed by the 12 mm port. The pressure was changed to 8 mm Hg.  Hemostasis was ensured.The fascia of the 12 mm port was closed with a 0 vicryl by suture passer. Pneumoperitoneum was evacuated, all ports were removed and all incisions closed with 4-0 monocryl suture in subcuticular fashion. Steristrips and bandaids were put in place for dressing. The patient awoke from anesthesia and was brought to pacu in stable condition. All counts were correct.  Estimated blood loss: <83m  Specimens:  Sleeve gastrectomy  Local Anesthesia: 50 ml Exparel:0.5% Marcaine mix  Post-Op Plan:       Pain Management: PO, prn      Antibiotics: Prophylactic      Anticoagulation: Prophylactic, Starting now      Post Op Studies/Consults: Not applicable      Intended Discharge: within 48h      Intended Outpatient Follow-Up: Two Week      Intended Outpatient Studies: Not Applicable      Other: Not Applicable  LArta BruceKinsinger

## 2022-04-08 NOTE — H&P (Signed)
Chief Complaint: Obesity   History of Present Illness: Elizabeth Foster is a 36 y.o. female who is seen today for bariatric preop.  She has completed all requirements and is ready to proceed with sleeve gastrectomy and hiatal hernia repair  Review of Systems: A complete review of systems was obtained from the patient. I have reviewed this information and discussed as appropriate with the patient. See HPI as well for other ROS.  Review of Systems  Constitutional: Negative.  HENT: Negative.  Eyes: Negative.  Respiratory: Negative.  Cardiovascular: Negative.  Gastrointestinal: Negative.  Genitourinary: Negative.  Musculoskeletal: Negative.  Skin: Negative.  Neurological: Negative.  Endo/Heme/Allergies: Negative.  Psychiatric/Behavioral: Negative.    Medical History: Past Medical History:  Diagnosis Date  Anxiety  GERD (gastroesophageal reflux disease)   There is no problem list on file for this patient.  Past Surgical History:  Procedure Laterality Date  CESAREAN SECTION 04/05/2017  ARTHROSCOPY KNEE Bilateral  2000?  Middle Ear Surgery- Removal of Growth  early childhood  TONSILLECTOMY & ADENOIDECTOMY  early childhood    Allergies  Allergen Reactions  Bacitracin-Polymyxin B Unknown  Blister on eye and eye was swollen  Docusate Calcium Unknown  Chest tight, nausea, vomiting, sweating  Amoxicillin Rash  PT CAN TAKE KEFLEX WITH NO PROBLEMS PER PT AND HER PRENATAL RECORD Has patient had a PCN reaction causing immediate rash, facial/tongue/throat swelling, SOB or lightheadedness with hypotension: Yes Has patient had a PCN reaction causing severe rash involving mucus membranes or skin necrosis: No Has patient had a PCN reaction that required hospitalization: No Has patient had a PCN reaction occurring within the last 10 years: No If all of the above answers are "NO", then may proceed with Cephalospori   Current Outpatient Medications on File Prior to Visit  Medication  Sig Dispense Refill  ergocalciferol, vitamin D2, (VITAMIN D2 ORAL) Take by mouth  fexofenadine (ALLEGRA ODT) 30 MG disintegrating tablet Take by mouth  multivitamin capsule Take 1 capsule by mouth once daily  omega-3 fatty acids/fish oil (FISH OIL) 340-1,000 mg capsule Take by mouth  pramipexole (MIRAPEX) 1 MG tablet Take 1 tablet by mouth at bedtime  sertraline (ZOLOFT) 50 MG tablet Take 1.5 tablets by mouth once daily   No current facility-administered medications on file prior to visit.   History reviewed. No pertinent family history.   Social History   Tobacco Use  Smoking Status Never  Smokeless Tobacco Never    Social History   Socioeconomic History  Marital status: Married  Tobacco Use  Smoking status: Never  Smokeless tobacco: Never  Substance and Sexual Activity  Alcohol use: Never  Drug use: Never   Objective:   Vitals:  03/29/22 1006  BP: (!) 132/90  Pulse: 92  Resp: 16  Temp: 36.2 C (97.2 F)  Weight: (!) 128.1 kg (282 lb 6.4 oz)  Height: 162.6 cm ('5\' 4"'$ )   Body mass index is 48.47 kg/m.  Physical Exam Constitutional:  Appearance: Normal appearance.  HENT:  Head: Normocephalic and atraumatic.  Pulmonary:  Effort: Pulmonary effort is normal.  Musculoskeletal:  General: Normal range of motion.  Cervical back: Normal range of motion.  Neurological:  General: No focal deficit present.  Mental Status: She is alert and oriented to person, place, and time. Mental status is at baseline.  Psychiatric:  Mood and Affect: Mood normal.  Behavior: Behavior normal.  Thought Content: Thought content normal.    Labs, Imaging and Diagnostic Testing:  I reviewed notes by dietitian and UGI and lab  results  Assessment and Plan:   Diagnoses and all orders for this visit:  Morbid (severe) obesity due to excess calories (CMS-HCC)  Gastroesophageal reflux disease without esophagitis  Vitamin deficiency  Diaphragmatic hernia without obstruction or  gangrene   The patient meets weight loss surgery criteria. Due to the above reasons, I think minimally invasive vertical sleeve gastrectomy is the best option for the patient.   We discussed sleeve gastrectomy. We discussed the preoperative, operative and postoperative process. I explained the surgery in detail including the performance of an EGD near the end of the surgery to test for leak. We discussed the typical hospital course including a 1-2 day stay baring any complications. The patient was given educational material. I quoted the patient that most patients can lose up to 50-70% of their excess weight. We did discuss the possibility of weight regain several years after the procedure.   The risks of infection, bleeding, pain, scarring, weight regain, too little or too much weight loss, vitamin deficiencies and need for lifelong vitamin supplementation, hair loss, need for protein supplementation, leaks, stricture, reflux, food intolerance, gallstone formation, hernia, need for reoperation, need for open surgery, injury to spleen or surrounding structures, DVT's, PE, and death again discussed with the patient and the patient expressed understanding and desires to proceed with minimally invasive sleeve gastrectomy, possible open, intraoperative endoscopy.  We discussed that before and after surgery that there would be an alteration in their diet. I explained that we may put them on a diet 2 weeks before surgery. I also explained that they would be on a liquid diet for 2 weeks after surgery. We discussed that they would have to avoid certain foods after surgery. We discussed the importance of physical activity as well as compliance with our dietary and supplement recommendations and routine follow-up.

## 2022-04-08 NOTE — Anesthesia Procedure Notes (Signed)
Procedure Name: Intubation Date/Time: 04/08/2022 7:33 AM  Performed by: Kinga Cassar D, CRNAPre-anesthesia Checklist: Patient identified, Emergency Drugs available, Suction available and Patient being monitored Patient Re-evaluated:Patient Re-evaluated prior to induction Oxygen Delivery Method: Circle system utilized Preoxygenation: Pre-oxygenation with 100% oxygen Induction Type: IV induction Ventilation: Mask ventilation without difficulty Laryngoscope Size: Mac and 4 Grade View: Grade I Tube type: Oral Tube size: 7.0 mm Number of attempts: 1 Airway Equipment and Method: Stylet and Oral airway Placement Confirmation: ETT inserted through vocal cords under direct vision, positive ETCO2 and breath sounds checked- equal and bilateral Secured at: 22 cm Tube secured with: Tape Dental Injury: Teeth and Oropharynx as per pre-operative assessment

## 2022-04-08 NOTE — Anesthesia Preprocedure Evaluation (Addendum)
Anesthesia Evaluation  Patient identified by MRN, date of birth, ID band Patient awake    Reviewed: Allergy & Precautions, H&P , NPO status , Patient's Chart, lab work & pertinent test results  Airway Mallampati: I  TM Distance: >3 FB Neck ROM: Full    Dental  (+) Dental Advisory Given, Teeth Intact   Pulmonary neg pulmonary ROS   Pulmonary exam normal breath sounds clear to auscultation       Cardiovascular hypertension, Normal cardiovascular exam Rhythm:Regular Rate:Normal     Neuro/Psych  PSYCHIATRIC DISORDERS Anxiety Depression    negative neurological ROS     GI/Hepatic Neg liver ROS,GERD  Controlled and Medicated,,  Endo/Other    Morbid obesityBMI 49  Renal/GU negative Renal ROS  negative genitourinary   Musculoskeletal  (+) Arthritis , Osteoarthritis,    Abdominal  (+) + obese  Peds negative pediatric ROS (+)  Hematology negative hematology ROS (+) Hb 13.7, plt 327   Anesthesia Other Findings   Reproductive/Obstetrics negative OB ROS                             Anesthesia Physical Anesthesia Plan  ASA: 3  Anesthesia Plan: General   Post-op Pain Management: Tylenol PO (pre-op)*, Dilaudid IV and Ketamine IV*   Induction: Intravenous  PONV Risk Score and Plan: 4 or greater and Ondansetron, Dexamethasone, Midazolam, Scopolamine patch - Pre-op and Treatment may vary due to age or medical condition  Airway Management Planned: Oral ETT  Additional Equipment: None  Intra-op Plan:   Post-operative Plan: Extubation in OR  Informed Consent: I have reviewed the patients History and Physical, chart, labs and discussed the procedure including the risks, benefits and alternatives for the proposed anesthesia with the patient or authorized representative who has indicated his/her understanding and acceptance.     Dental advisory given  Plan Discussed with: CRNA  Anesthesia Plan  Comments:        Anesthesia Quick Evaluation

## 2022-04-09 ENCOUNTER — Other Ambulatory Visit (HOSPITAL_COMMUNITY): Payer: Self-pay

## 2022-04-09 ENCOUNTER — Encounter (HOSPITAL_COMMUNITY): Payer: Self-pay | Admitting: General Surgery

## 2022-04-09 LAB — CBC WITH DIFFERENTIAL/PLATELET
Abs Immature Granulocytes: 0.04 10*3/uL (ref 0.00–0.07)
Basophils Absolute: 0 10*3/uL (ref 0.0–0.1)
Basophils Relative: 0 %
Eosinophils Absolute: 0 10*3/uL (ref 0.0–0.5)
Eosinophils Relative: 0 %
HCT: 37.5 % (ref 36.0–46.0)
Hemoglobin: 12 g/dL (ref 12.0–15.0)
Immature Granulocytes: 0 %
Lymphocytes Relative: 24 %
Lymphs Abs: 2.8 10*3/uL (ref 0.7–4.0)
MCH: 26.3 pg (ref 26.0–34.0)
MCHC: 32 g/dL (ref 30.0–36.0)
MCV: 82.2 fL (ref 80.0–100.0)
Monocytes Absolute: 0.8 10*3/uL (ref 0.1–1.0)
Monocytes Relative: 7 %
Neutro Abs: 7.9 10*3/uL — ABNORMAL HIGH (ref 1.7–7.7)
Neutrophils Relative %: 69 %
Platelets: 272 10*3/uL (ref 150–400)
RBC: 4.56 MIL/uL (ref 3.87–5.11)
RDW: 14 % (ref 11.5–15.5)
WBC: 11.7 10*3/uL — ABNORMAL HIGH (ref 4.0–10.5)
nRBC: 0 % (ref 0.0–0.2)

## 2022-04-09 MED ORDER — ACETAMINOPHEN 500 MG PO TABS: 1000.0000 mg | ORAL_TABLET | Freq: Three times a day (TID) | ORAL | 0 refills | Status: AC

## 2022-04-09 MED ORDER — ONDANSETRON 4 MG PO TBDP
4.0000 mg | ORAL_TABLET | Freq: Four times a day (QID) | ORAL | 0 refills | Status: DC | PRN
  Filled 2022-04-09: qty 20, 5d supply, fill #0

## 2022-04-09 NOTE — Discharge Summary (Signed)
Physician Discharge Summary  Elizabeth Foster NKN:[id number] DOB: [birth year] DOA: 04/08/2022  PCP: [name], MD  Admit date: 04/08/2022 Discharge date: 04/09/2022  Recommendations for Outpatient Follow-up:   (include homehealth, outpatient follow-up instructions, specific recommendations for PCP to follow-up on, etc.)   Follow-up Information     Saory Carriero, Arta Bruce, MD Follow up on 05/06/2022.   Specialty: General Surgery Contact information: [address] [place] [address] [phone]         Aubrii Sharpless, Arta Bruce, MD Follow up on 05/31/2022.   Specialty: General Surgery Contact information: [address] [place] [address] [phone]                Discharge Diagnoses:  Principal Problem:   Morbid (severe) obesity due to excess calories [facility])   Surgical Procedure: laparoscopic sleeve gastrectomy, upper endoscopy  Discharge Condition: Good Disposition: Home  Diet recommendation: Postoperative sleeve gastrectomy diet (liquids only)  Filed Weights   04/08/22 0603  Weight: 128.7 kg     Hospital Course:  The patient was admitted after undergoing laparoscopic sleeve gastrectomy. POD 0 she ambulated well. POD 1 she was started on the water diet protocol and tolerated 510 ml in the first shift. Once meeting the water amount she was advanced to bariatric protein shakes which they tolerated and were discharged home POD 1.  Treatments: surgery: laparoscopic sleeve gastrectomy  Discharge Instructions  Discharge Instructions     Ambulate hourly while awake   Complete by: As directed    Call MD for:  difficulty breathing, headache or visual disturbances   Complete by: As directed    Call MD for:  persistant dizziness or light-headedness   Complete by: As directed    Call MD for:  persistant nausea and vomiting   Complete by: As directed    Call MD for:  redness, tenderness, or signs of infection (pain,  swelling, redness, odor or green/yellow discharge around incision site)   Complete by: As directed    Call MD for:  severe uncontrolled pain   Complete by: As directed    Call MD for:  temperature >101 F   Complete by: As directed    Diet bariatric full liquid   Complete by: As directed    Discharge wound care:   Complete by: As directed    Remove Bandaids tomorrow, ok to shower tomorrow. Steristrips may fall off in 1-3 weeks.   Incentive spirometry   Complete by: As directed    Perform hourly while awake      Allergies as of 04/09/2022       Reactions   Polytrim [polymyxin B-trimethoprim]    Blister on eye and eye was swollen   Amoxicillin Rash   PT CAN TAKE KEFLEX WITH NO PROBLEMS PER PT AND HER PRENATAL RECORD Has patient had a PCN reaction causing immediate rash, facial/tongue/throat swelling, SOB or lightheadedness with hypotension: Yes Has patient had a PCN reaction causing severe rash involving mucus membranes or skin necrosis: No Has patient had a PCN reaction that required hospitalization: No Has patient had a PCN reaction occurring within the last 10 years: No If all of the above answers are "NO", then may proceed with Cephalospori        Medication List     STOP taking these medications    doxycycline 100 MG tablet Commonly known as: VIBRA-TABS   fexofenadine 180 MG tablet Commonly known as: ALLEGRA   meclizine 25 MG tablet  Commonly known as: ANTIVERT   pramipexole 1 MG tablet Commonly known as: MIRAPEX   sertraline 50 MG tablet Commonly known as: ZOLOFT   Unifine Pentips 32G X 4 MM Misc Generic drug: Insulin Pen Needle   Wegovy 0.25 MG/0.5ML Soaj Generic drug: Semaglutide-Weight Management   Wegovy 0.5 MG/0.5ML Soaj Generic drug: Semaglutide-Weight Management       TAKE these medications    acetaminophen 500 MG tablet Commonly known as: TYLENOL Take 2 tablets (1,000 mg total) by mouth every 8 (eight) hours for 5 days. What changed:   medication strength how much to take when to take this reasons to take this   alum & mag hydroxide-simeth 485-462-70 MG/5ML suspension Commonly known as: MAALOX/MYLANTA Take 20 mLs by mouth every 6 (six) hours as needed for indigestion or heartburn.   calcium carbonate 500 MG chewable tablet Commonly known as: TUMS - dosed in mg elemental calcium Chew 1 tablet by mouth daily as needed for indigestion or heartburn.   Fish Oil 1200 MG Caps Take 1,200 mg by mouth in the morning and at bedtime.   fluticasone 50 MCG/ACT nasal spray Commonly known as: FLONASE Place 1 spray into both nostrils daily as needed for allergies.   multivitamin capsule Take 1 capsule by mouth daily.   omeprazole 20 MG capsule Commonly known as: PRILOSEC Take 1 capsule (20 mg total) by mouth 2 (two) times daily before a meal.   ondansetron 4 MG disintegrating tablet Commonly known as: ZOFRAN-ODT Take 1 tablet (4 mg total) by mouth every 6 (six) hours as needed for nausea or vomiting.   VITAMIN D PO Take 5,000 Units by mouth daily.               Discharge Care Instructions  (From admission, onward)           Start     Ordered   04/09/22 0000  Discharge wound care:       Comments: Remove Bandaids tomorrow, ok to shower tomorrow. Steristrips may fall off in 1-3 weeks.   04/09/22 3500            Follow-up Information     Amoreena Neubert, Arta Bruce, MD Follow up on 05/06/2022.   Specialty: General Surgery Contact information: [address] [place] [address] [phone]         Arville Postlewaite, Arta Bruce, MD Follow up on 05/31/2022.   Specialty: General Surgery Contact information: [address] [place] [address] [phone]                  The results of significant diagnostics from this hospitalization (including imaging, microbiology, ancillary and laboratory) are listed below for reference.    Significant Diagnostic  Studies: No results found.  Labs: Basic Metabolic Panel: Recent Labs  Lab 04/08/22 0630 04/08/22 1311  NA 138  --   K 3.6  --   CL 107  --   CO2 24  --   GLUCOSE 112*  --   BUN 13  --   CREATININE 0.60 0.72  CALCIUM 9.4  --    Liver Function Tests: Recent Labs  Lab 04/08/22 0630  AST 17  ALT 16  ALKPHOS 56  BILITOT 0.7  PROT 7.9  ALBUMIN 4.3    CBC: Recent Labs  Lab 04/08/22 0630 04/08/22 1311 04/09/22 0538  WBC 8.2 13.1* 11.7*  NEUTROABS 5.2  --  7.9*  HGB 13.2 12.6 12.0  HCT 40.8 38.4 37.5  MCV 81.9 81.9 82.2  PLT 282 280 272    CBG: No results for input(s): "GLUCAP" in the last 168 hours.  Principal Problem:   Morbid (severe) obesity due to excess calories ([place])   VTE plan: no chemical prophylaxis recommended ([email or web address])  Time coordinating discharge: 15 min

## 2022-04-09 NOTE — Progress Notes (Signed)
Patient alert and oriented, pain is controlled. Patient is tolerating fluids, advanced to protein shake today, patient is tolerating well.  Reviewed Gastric sleeve discharge instructions with patient and patient is able to articulate understanding.  Provided information on BELT program, Support Group and [facility] outpatient pharmacy. All questions answered, will continue to monitor.  

## 2022-04-09 NOTE — Discharge Instructions (Signed)

## 2022-04-09 NOTE — Progress Notes (Signed)
Discharge instructions given to patient and all questions were answered.  

## 2022-04-10 ENCOUNTER — Other Ambulatory Visit (HOSPITAL_COMMUNITY): Payer: Self-pay

## 2022-04-11 ENCOUNTER — Telehealth: Payer: Self-pay | Admitting: Dietician

## 2022-04-11 ENCOUNTER — Telehealth (HOSPITAL_COMMUNITY): Payer: Self-pay | Admitting: *Deleted

## 2022-04-11 ENCOUNTER — Other Ambulatory Visit (HOSPITAL_COMMUNITY): Payer: Self-pay

## 2022-04-11 LAB — SURGICAL PATHOLOGY

## 2022-04-11 NOTE — Telephone Encounter (Signed)
Pt called asking if she can cook canned oysters with [employer] milk, and not eat the oysters, stating just to flavor the milk.  Dietitian advised that she strain the milk/broth mixture, recommended skim/lowfat milk and reminded pt of the standard to keep fat and sugar in the single digits, per serving.

## 2022-04-11 NOTE — Telephone Encounter (Signed)
1.  Tell me about your pain and pain management? Pt states that "the pain on the right side is getting better with taking the Tylenol scheduled".  2.  Let's talk about fluid intake.  How much total fluid are you taking in? Pt states that she is getting in at least 52-58oz of fluid including protein shakes, Core water, Gatorade Zero and broth.  Pt instructed to assess status and suggestions daily utilizing Hydration Action Plan on discharge folder and to call [facility] if in the "red zone".   3.  How much protein have you taken in the last 2 days? Pt states she is meeting her goal of 60g of protein each day with the protein shakes.  4.  Have you had nausea?  Tell me about when have experienced nausea and what you did to help? Pt denies nausea.   5.  Has the frequency or color changed with your urine? Pt states that she is urinating "fine" with no changes in frequency or urgency.     6.  Tell me what your incisions look like? "Incisions look fine". Pt denies a fever, chills.  Pt states incisions are not swollen, open, or draining.  Pt encouraged to call [facility] if incisions change.   7.  Have you been passing gas? BM? Pt states that she has not had a BM.  Pt instructed to take either Miralax or MoM as instructed per "Gastric Bypass/Sleeve Discharge Home Care Instructions".  Pt states that she has taken Miralax at 0400 and 1030 today.  Instructed pt not to take any more today and to wait until tomorrow if no BM. Pt to call surgeon's office if not able to have BM with medication.   8.  If a problem or question were to arise who would you call?  Do you know contact numbers for [facility], [facility], and [facility]? Pt denies dehydration symptoms.  Pt can describe s/sx of dehydration.  Pt knows to call [facility] for surgical, [facility] for nutrition, and [place] [facility] for non-urgent questions or concerns.   9.  How has the walking going? Pt states she is walking around and able to be active without difficulty. Pt states that she has been able to  take her dog outside to use the restroom several times.   10. Are you still using your incentive spirometer?  If so, how often? Pt states that she is doing the I.S.  and getting up to 1750. Pt encouraged to use incentive spirometer, at least 10x every hour while awake until she sees the surgeon.  11.  How are your vitamins and calcium going?  How are you taking them? Pt states that she will begin the supplements tomorrow.  Reinforced education about taking supplements at least two hours apart.  Reminded patient that the first 30 days post-operatively are important for successful recovery.  Practice good hand hygiene, wearing a mask when appropriate (since optional in most places), and minimizing exposure to people who live outside of the home, especially if they are exhibiting any respiratory, GI, or illness-like symptoms.

## 2022-04-18 ENCOUNTER — Encounter: Payer: Self-pay | Admitting: Family Medicine

## 2022-04-22 ENCOUNTER — Other Ambulatory Visit: Payer: Self-pay

## 2022-04-22 MED ORDER — SERTRALINE HCL 50 MG PO TABS: ORAL_TABLET | ORAL | Status: DC

## 2022-04-22 MED ORDER — ACETAMINOPHEN 500 MG PO TABS: ORAL_TABLET | ORAL | 0 refills | Status: AC

## 2022-04-22 MED ORDER — VIACTIV CALCIUM PLUS D 650-12.5-40 MG-MCG PO CHEW: CHEWABLE_TABLET | ORAL | Status: AC

## 2022-04-22 MED ORDER — SERTRALINE HCL 25 MG PO TABS: ORAL_TABLET | ORAL | Status: DC

## 2022-04-22 MED ORDER — FEXOFENADINE-PSEUDOEPHED ER 180-240 MG PO TB24: ORAL_TABLET | ORAL | Status: DC

## 2022-04-22 MED ORDER — MULTIVITAMINS PO CAPS: ORAL_CAPSULE | ORAL | Status: AC

## 2022-04-22 MED ORDER — PRAMIPEXOLE DIHYDROCHLORIDE 1 MG PO TABS: ORAL_TABLET | ORAL | Status: DC

## 2022-04-22 MED ORDER — POLYETHYLENE GLYCOL 3350 17 G PO PACK: PACK | ORAL | 0 refills | Status: AC

## 2022-04-22 MED ORDER — OMEPRAZOLE 20 MG PO CPDR: DELAYED_RELEASE_CAPSULE | ORAL | 3 refills | Status: DC

## 2022-04-22 NOTE — Telephone Encounter (Signed)
Nurses-please connect with Michelle-you may share this with her. Please download the forms that she sent.  Make sure that our medicine list reflects what she is taking.  Also I would recommend a follow-up visit in approximately 6  to 8 weeks.  I assume that she is doing close follow-ups with the surgeons currently so therefore does not necessarily have to do a follow-up right now with [place] unless he feels there is a need.  Thanks-Dr. Nicki Reaper  Hi Elizabeth Foster to hear your surgery went well.  If you should have any problems that need or help feel free to reach out.  Otherwise I like to follow-up in 6 to 8 weeks.  Have a nice Thanksgiving in [place]

## 2022-04-23 ENCOUNTER — Encounter: Payer: Self-pay | Admitting: Dietician

## 2022-04-23 ENCOUNTER — Encounter: Payer: No Typology Code available for payment source | Attending: General Surgery | Admitting: Dietician

## 2022-04-23 VITALS — Ht 64.0 in | Wt 271.7 lb

## 2022-04-23 DIAGNOSIS — E669 Obesity, unspecified: Secondary | ICD-10-CM | POA: Diagnosis not present

## 2022-04-23 NOTE — Progress Notes (Signed)
2 Week Post-Operative Nutrition Class   Patient was seen on 04/23/2022 for Post-Operative Nutrition education at the [facility].    Surgery date: 04/08/2022 Surgery type: sleeve  Anthropometrics  Start weight at [facility]: 290.1 lbs (date: 02/07/2022)  Height: 64 in Weight today: 271.7 BMI: 46.64 kg/m2     Clinical  Medical hx: IBS, GERD, obesity, high cholesterol, HTN, anxiety Medications: Omeprazole, Zoloft, multivitamin, fish oil, Mirapex vit d, Allegra Wegovy  Labs:  Notable signs/symptoms: none noted Any previous deficiencies? No Bowel Habits: Every day to every other day no complaints   Body Composition Scale 04/23/2022  Current Body Weight 271.7  Total Body Fat % 46.5  Visceral Fat 15  Fat-Free Mass % 53.4   Total Body Water % 41.2  Muscle-Mass lbs 32.9  BMI 46.6  Body Fat Displacement          Torso  lbs 78.5         Left Leg  lbs 15.7         Right Leg  lbs 15.7         Left Arm  lbs 7.8         Right Arm   lbs 7.8      The following the learning objectives were met by the patient during this course: Identifies Phase 3 (Soft, High Proteins) Dietary Goals and will begin from 2 weeks post-operatively to 2 months post-operatively Identifies appropriate sources of fluids and proteins  Identifies appropriate fat sources and healthy verses unhealthy fat types   States protein recommendations and appropriate sources post-operatively Identifies the need for appropriate texture modifications, mastication, and bite sizes when consuming solids Identifies appropriate fat consumption and sources Identifies appropriate multivitamin and calcium sources post-operatively Describes the need for physical activity post-operatively and will follow MD recommendations States when to call healthcare provider regarding medication questions or post-operative complications   Handouts given during class include: Phase 3A: Soft, High Protein Diet Handout Phase  3 High Protein Meals Healthy Fats   Follow-Up Plan: Patient will follow-up at [facility] in 6 weeks for 2 month post-op nutrition visit for diet advancement per MD.

## 2022-04-29 ENCOUNTER — Telehealth: Payer: Self-pay | Admitting: Dietician

## 2022-04-29 NOTE — Telephone Encounter (Signed)
RD called pt to verify fluid intake once starting soft, solid proteins 2 week post-bariatric surgery.   Daily Fluid intake: 26-30 oz Daily Protein intake: 60-72 gr Bowel Habits: good, still taking Miralax   Concerns/issues:  Pt states she drank some water right after eating, stating that she burped water/reflux.  Pt states eggs are a friend.  Pt states she is carrying her water with her, trying to push the fluids.  Dietitian encouraged pt to continue sipping between meals.

## 2022-05-01 ENCOUNTER — Other Ambulatory Visit: Payer: Self-pay

## 2022-05-01 MED ORDER — FEXOFENADINE HCL 180 MG PO TABS: 180.0000 mg | ORAL_TABLET | Freq: Every day | ORAL | Status: AC

## 2022-05-16 ENCOUNTER — Other Ambulatory Visit: Payer: Self-pay | Admitting: Internal Medicine

## 2022-05-16 ENCOUNTER — Other Ambulatory Visit (HOSPITAL_COMMUNITY): Payer: Self-pay

## 2022-05-16 ENCOUNTER — Other Ambulatory Visit: Payer: Self-pay

## 2022-05-16 MED ORDER — OMEPRAZOLE 20 MG PO CPDR
20.0000 mg | DELAYED_RELEASE_CAPSULE | Freq: Two times a day (BID) | ORAL | 5 refills | Status: DC
  Filled 2022-05-16: qty 60, 30d supply, fill #0
  Filled 2022-06-17: qty 60, 30d supply, fill #1
  Filled 2022-07-17: qty 60, 30d supply, fill #2
  Filled 2022-08-13: qty 60, 30d supply, fill #3
  Filled 2022-09-10: qty 60, 30d supply, fill #4
  Filled 2022-10-12: qty 60, 30d supply, fill #5

## 2022-06-05 ENCOUNTER — Encounter: Payer: Self-pay | Admitting: Skilled Nursing Facility1

## 2022-06-05 ENCOUNTER — Encounter: Payer: Managed Care, Other (non HMO) | Attending: General Surgery | Admitting: Skilled Nursing Facility1

## 2022-06-05 VITALS — Ht 64.0 in | Wt 255.1 lb

## 2022-06-05 DIAGNOSIS — E669 Obesity, unspecified: Secondary | ICD-10-CM

## 2022-06-05 NOTE — Progress Notes (Signed)
Bariatric Nutrition Follow-Up Visit Medical Nutrition Therapy    Surgery date: 04/08/2022 Surgery type: sleeve  Anthropometrics  Start weight at [facility]: 290.1 lbs (date: 02/07/2022)  Height: 64 in Weight today: 255.1 lb BMI: 43.7 kg/m2     Clinical  Medical hx: IBS, GERD, obesity, high cholesterol, HTN, anxiety Medications: Omeprazole, Zoloft, multivitamin, fish oil, Mirapex vit d, Allegra Wegovy  Labs: Iron saturation 8 (low) Notable signs/symptoms: none noted Any previous deficiencies? No Bowel Habits: Every day to every other day no complaints   Body Composition Scale 04/23/2022 06/05/2022  Current Body Weight 271.7 255.1  Total Body Fat % 46.5 45.1  Visceral Fat 15 14  Fat-Free Mass % 53.4 54.8   Total Body Water % 41.2 41.9  Muscle-Mass lbs 32.9 32.7  BMI 46.6 43.7  Body Fat Displacement           Torso  lbs 78.5 71.3         Left Leg  lbs 15.7 14.2         Right Leg  lbs 15.7 14.2         Left Arm  lbs 7.8 7.1         Right Arm   lbs 7.8 7.1     Lifestyle & Dietary Hx  Pt arrives with her three supportive children. Pt states her family has made the healthy diet changes with her. Pt states she is doing well with the process.  Pt states her and her family are making more meals from home and eating out less.  Pt states she was recently sick which effected her fluid intake. Pt states she is still doing Miralax twice a week to help with some constipation. Pt states she is excited to add in new vegetables and fruit to add variety to her diet.    Estimated daily fluid intake: 40-60 oz Estimated daily protein intake: 60-90 g Supplements: multivitamin and calcium  Current average weekly physical activity: walking; looking into boxing classes    24-Hr Dietary Recall First Meal: scrambled eggs and 2 slices of bacon  Snack: cheese sticks  Second Meal: deli lunch meat with cheese or ground hamburger with seasoning  Snack: greek yogurt  Third Meal: chicken or hamburger  and black beans or pinto beans Snack: None reported.  Beverages: water with flavor packets, Gatorade zero, tea packets with water, protein shakes  Post-Op Goals/ Signs/ Symptoms Using straws: yes Drinking while eating: no Chewing/swallowing difficulties: no Changes in vision: no Changes to mood/headaches: no Hair loss/changes to skin/nails: some Difficulty focusing/concentrating: no Sweating: no Limb weakness: no Dizziness/lightheadedness: no Palpitations: no Carbonated/caffeinated beverages: no N/V/D/C/Gas: no Abdominal pain: no Dumping syndrome: no    NUTRITION DIAGNOSIS  Overweight/obesity ([place]-3.3) related to past poor dietary habits and physical inactivity as evidenced by completed bariatric surgery and following dietary guidelines for continued weight loss and healthy nutrition status.     NUTRITION INTERVENTION Nutrition counseling (C-1) and education (E-2) to facilitate bariatric surgery goals, including: Diet advancement to the next phase (phase 4) now including fruits and vegetables The importance of consuming adequate calories as well as certain nutrients daily due to the body's need for essential vitamins, minerals, and fats The importance of daily physical activity and to reach a goal of at least 150 minutes of moderate to vigorous physical activity weekly (or as directed by their physician) due to benefits such as increased musculature and improved lab values The importance of intuitive eating specifically learning hunger-satiety cues and understanding the importance  of learning a new body: The importance of mindful eating to avoid grazing behaviors  Discussed with pt how many fruits and vegetables to add in each day and the role of fiber in the diet.  Encouraged pt to increase water intake when possible, to compensate for increased fiber intake.   Handouts Provided Include  Phase 4  Learning Style & Readiness for Change Teaching method utilized: Visual & Auditory   Demonstrated degree of understanding via: Teach Back  Readiness Level: action Barriers to learning/adherence to lifestyle change: None reported.  RD's Notes for Next Visit Assess adherence to pt chosen goals  Goals: Add in alkaline water if needed. Add in physical activity (boxing) several times a week. Adding in fruits and vegetables 2-3 times/day.   MONITORING & EVALUATION Dietary intake, weekly physical activity, body weight.  Next Steps Patient is to follow-up in 3 months for 5 month post-op follow-up.

## 2022-06-06 ENCOUNTER — Other Ambulatory Visit (HOSPITAL_COMMUNITY): Payer: Self-pay

## 2022-06-06 MED ORDER — FERROUS SULFATE 325 (65 FE) MG PO TBEC
1.0000 | DELAYED_RELEASE_TABLET | Freq: Every day | ORAL | 11 refills | Status: DC
  Filled 2022-06-06: qty 100, 100d supply, fill #0
  Filled 2022-08-13: qty 30, 30d supply, fill #0
  Filled 2022-09-10: qty 30, 30d supply, fill #1
  Filled 2022-10-07: qty 30, 30d supply, fill #2
  Filled 2022-11-14: qty 30, 30d supply, fill #3
  Filled 2022-12-18: qty 30, 30d supply, fill #4
  Filled 2023-01-16: qty 30, 30d supply, fill #5
  Filled 2023-02-17: qty 30, 30d supply, fill #6
  Filled 2023-04-02: qty 30, 30d supply, fill #7
  Filled 2023-05-03: qty 30, 30d supply, fill #8
  Filled 2023-06-02: qty 30, 30d supply, fill #9

## 2022-06-07 ENCOUNTER — Other Ambulatory Visit (HOSPITAL_COMMUNITY): Payer: Self-pay

## 2022-06-10 ENCOUNTER — Other Ambulatory Visit (HOSPITAL_COMMUNITY): Payer: Self-pay

## 2022-06-18 ENCOUNTER — Other Ambulatory Visit (HOSPITAL_COMMUNITY): Payer: Self-pay

## 2022-06-18 ENCOUNTER — Other Ambulatory Visit: Payer: Self-pay

## 2022-06-20 ENCOUNTER — Other Ambulatory Visit: Payer: Self-pay

## 2022-07-05 ENCOUNTER — Other Ambulatory Visit: Payer: Self-pay | Admitting: Family Medicine

## 2022-07-05 ENCOUNTER — Other Ambulatory Visit (HOSPITAL_COMMUNITY): Payer: Self-pay

## 2022-07-06 MED ORDER — PRAMIPEXOLE DIHYDROCHLORIDE 1 MG PO TABS
1.0000 mg | ORAL_TABLET | Freq: Every day | ORAL | 1 refills | Status: DC
  Filled 2022-07-06: qty 30, 30d supply, fill #0
  Filled 2022-08-04: qty 30, 30d supply, fill #1
  Filled 2022-09-03: qty 30, 30d supply, fill #2
  Filled 2022-10-04: qty 30, 30d supply, fill #3
  Filled 2022-10-31: qty 30, 30d supply, fill #4

## 2022-07-08 ENCOUNTER — Other Ambulatory Visit: Payer: Self-pay

## 2022-07-17 ENCOUNTER — Other Ambulatory Visit (HOSPITAL_COMMUNITY): Payer: Self-pay

## 2022-08-01 ENCOUNTER — Encounter: Payer: Self-pay | Admitting: Radiology

## 2022-08-05 ENCOUNTER — Other Ambulatory Visit: Payer: Self-pay

## 2022-08-14 ENCOUNTER — Other Ambulatory Visit (HOSPITAL_COMMUNITY): Payer: Self-pay

## 2022-08-14 ENCOUNTER — Other Ambulatory Visit: Payer: Self-pay

## 2022-09-04 ENCOUNTER — Ambulatory Visit: Payer: Self-pay | Admitting: Skilled Nursing Facility1

## 2022-09-04 ENCOUNTER — Other Ambulatory Visit: Payer: Self-pay

## 2022-09-10 ENCOUNTER — Other Ambulatory Visit: Payer: Self-pay | Admitting: Family Medicine

## 2022-09-10 DIAGNOSIS — F419 Anxiety disorder, unspecified: Secondary | ICD-10-CM

## 2022-09-11 ENCOUNTER — Other Ambulatory Visit (HOSPITAL_COMMUNITY): Payer: Self-pay

## 2022-09-13 ENCOUNTER — Other Ambulatory Visit: Payer: Self-pay | Admitting: Family Medicine

## 2022-09-16 MED ORDER — SERTRALINE HCL 50 MG PO TABS
ORAL_TABLET | ORAL | 1 refills | Status: DC
  Filled 2022-09-16: qty 30, 30d supply, fill #0
  Filled 2022-10-12: qty 30, 30d supply, fill #1

## 2022-09-16 MED ORDER — SERTRALINE HCL 25 MG PO TABS
ORAL_TABLET | ORAL | 1 refills | Status: DC
  Filled 2022-09-16: qty 30, 30d supply, fill #0
  Filled 2022-10-12: qty 30, 30d supply, fill #1

## 2022-09-17 ENCOUNTER — Encounter: Payer: Self-pay | Admitting: Family Medicine

## 2022-09-17 ENCOUNTER — Other Ambulatory Visit (HOSPITAL_COMMUNITY): Payer: Self-pay

## 2022-09-17 ENCOUNTER — Other Ambulatory Visit: Payer: Self-pay

## 2022-09-26 ENCOUNTER — Encounter: Payer: Self-pay | Admitting: Skilled Nursing Facility1

## 2022-09-26 ENCOUNTER — Encounter: Payer: Managed Care, Other (non HMO) | Attending: General Surgery | Admitting: Skilled Nursing Facility1

## 2022-09-26 VITALS — Ht 64.0 in | Wt 241.1 lb

## 2022-09-26 DIAGNOSIS — E669 Obesity, unspecified: Secondary | ICD-10-CM | POA: Diagnosis present

## 2022-09-26 NOTE — Progress Notes (Signed)
Bariatric Nutrition Follow-Up Visit Medical Nutrition Therapy    Surgery date: 04/08/2022 Surgery type: sleeve  Anthropometrics  Start weight at [facility]: 290.1 lbs (date: 02/07/2022)  Height: 64 in Weight today: 241   Clinical  Medical hx: IBS, GERD, obesity, high cholesterol, HTN, anxiety Medications: Omeprazole, Zoloft, multivitamin, fish oil, Mirapex vit d, Allegra Wegovy  Labs: Iron saturation 8; now taking an iron supplement 325 mg Notable signs/symptoms: none noted Any previous deficiencies? No Bowel Habits: Every day to every other day no complaints   Body Composition Scale 04/23/2022 06/05/2022 09/26/2022  Current Body Weight 271.7 255.1 241.1  Total Body Fat % 46.5 45.1 43.8  Visceral Fat Fat-Free Mass % 53.4 54.8 56.1   Total Body Water % 41.2 41.9 42.5  Muscle-Mass lbs 32.9 32.7 32.4  BMI 46.6 43.7 41.3  Body Fat Displacement            Torso  lbs 78.5 71.3 65.4         Left Leg  lbs 15.7 14.2 13         Right Leg  lbs 15.7 14.2 13         Left Arm  lbs 7.8 7.1 6.5         Right Arm   lbs 7.8 7.1 6.5     Lifestyle & Dietary Hx  Pt states she is in a good groove of label reading.    Estimated daily fluid intake: 40-60 oz Estimated daily protein intake: 60-90 g Supplements: multivitamin and calcium  Current average weekly physical activity: walking; stepper with resistance bands; up and walking around at sons games, bad mitten   24-Hr Dietary Recall First Meal: scrambled eggs and fried bologna or yogrut + granola and strawberry Snack: cheese sticks  Second Meal: lightly bread chicken bites and green beans Snack: greek yogurt  Third Meal: protein shake or salad or tuna or clams or oysters Snack:  Beverages: water with flavor packets, plain water, Gatorade zero, tea packets with water, protein shakes  Post-Op Goals/ Signs/ Symptoms Using straws: yes Drinking while eating: no Chewing/swallowing difficulties: no Changes in vision: no Changes to  mood/headaches: no Hair loss/changes to skin/nails: some Difficulty focusing/concentrating: no Sweating: no Limb weakness: no Dizziness/lightheadedness: no Palpitations: no Carbonated/caffeinated beverages: no N/V/D/C/Gas: no Abdominal pain: no Dumping syndrome: no    NUTRITION DIAGNOSIS  Overweight/obesity ([place]-3.3) related to past poor dietary habits and physical inactivity as evidenced by completed bariatric surgery and following dietary guidelines for continued weight loss and healthy nutrition status.     NUTRITION INTERVENTION Nutrition counseling (C-1) and education (E-2) to facilitate bariatric surgery goals, including: Diet advancement to the next phase (phase 4) now including fruits and vegetables The importance of consuming adequate calories as well as certain nutrients daily due to the body's need for essential vitamins, minerals, and fats The importance of daily physical activity and to reach a goal of at least 150 minutes of moderate to vigorous physical activity weekly (or as directed by their physician) due to benefits such as increased musculature and improved lab values The importance of intuitive eating specifically learning hunger-satiety cues and understanding the importance of learning a new body: The importance of mindful eating to avoid grazing behaviors  Discussed with pt how many fruits and vegetables to add in each day and the role of fiber in the diet.  Encouraged pt to increase water intake when possible, to compensate for increased fiber intake.  Creation of balanced and diverse  meals to increase the intake of nutrient-rich foods that provide essential vitamins, minerals, fiber, and phytonutrients  Variety of Fruits and Vegetables:  Aim for a colorful array of fruits and vegetables to ensure a wide range of nutrients. Include a mix of leafy greens, berries, citrus fruits, cruciferous vegetables, and more. Whole Grains: Choose whole grains over refined  grains. Examples include brown rice, quinoa, oats, whole wheat, and barley. Lean Proteins: Include lean sources of protein, such as poultry, fish, tofu, legumes, beans, lentils, and low-fat dairy products. Limit red and processed meats. Healthy Fats: Incorporate sources of healthy fats, including avocados, nuts, seeds, and olive oil. Limit saturated and trans fats found in fried and processed foods. Dairy or Dairy Alternatives: Choose low-fat or fat-free dairy products, or plant-based alternatives like almond or soy milk. Portion Control: Be mindful of portion sizes to avoid overeating. Pay attention to hunger and satisfaction cues. Limit Added Sugars: Minimize the consumption of sugary beverages, snacks, and desserts. Check food labels for added sugars and opt for natural sources of sweetness such as whole fruits. Hydration: Drink plenty of water throughout the day. Limit sugary drinks and excessive caffeine intake. Moderate Sodium Intake: Reduce the consumption of high-sodium foods. Use herbs and spices for flavor instead of excessive salt. Meal Planning and Preparation: Plan and prepare meals ahead of time to make healthier choices more convenient. Include a mix of food groups in each meal. Limit Processed Foods: Minimize the intake of highly processed and packaged foods that are often high in added sugars, salt, and unhealthy fats. Regular Physical Activity: Combine a healthy diet with regular physical activity for overall well-being. Aim for at least 150 minutes of moderate-intensity aerobic exercise per week, along with strength training. Moderation and Balance: Enjoy treats and indulgent foods in moderation, emphasizing balance rather than strict restriction.  Handouts Previously Provided Include  Phase 4  Learning Style & Readiness for Change Teaching method utilized: Visual & Auditory  Demonstrated degree of understanding via: Teach Back  Readiness Level: action Barriers  to learning/adherence to lifestyle change: None reported.  RD's Notes for Next Visit Assess adherence to pt chosen goals   MONITORING & EVALUATION Dietary intake, weekly physical activity, body weight.  Next Steps Patient is to follow-up in Early November

## 2022-10-04 ENCOUNTER — Other Ambulatory Visit (HOSPITAL_BASED_OUTPATIENT_CLINIC_OR_DEPARTMENT_OTHER): Payer: Self-pay

## 2022-10-08 ENCOUNTER — Other Ambulatory Visit: Payer: Self-pay

## 2022-10-14 ENCOUNTER — Other Ambulatory Visit: Payer: Self-pay

## 2022-10-19 ENCOUNTER — Other Ambulatory Visit (HOSPITAL_COMMUNITY): Payer: Self-pay

## 2022-10-31 ENCOUNTER — Other Ambulatory Visit: Payer: Self-pay

## 2022-11-06 ENCOUNTER — Other Ambulatory Visit (HOSPITAL_COMMUNITY): Payer: Self-pay

## 2022-11-06 ENCOUNTER — Other Ambulatory Visit: Payer: Self-pay

## 2022-11-06 ENCOUNTER — Ambulatory Visit: Payer: Managed Care, Other (non HMO) | Admitting: Family Medicine

## 2022-11-06 VITALS — BP 116/76 | HR 73 | Wt 238.4 lb

## 2022-11-06 DIAGNOSIS — R7309 Other abnormal glucose: Secondary | ICD-10-CM

## 2022-11-06 DIAGNOSIS — E785 Hyperlipidemia, unspecified: Secondary | ICD-10-CM

## 2022-11-06 DIAGNOSIS — G2581 Restless legs syndrome: Secondary | ICD-10-CM | POA: Diagnosis not present

## 2022-11-06 MED ORDER — SERTRALINE HCL 50 MG PO TABS
75.0000 mg | ORAL_TABLET | Freq: Every day | ORAL | 3 refills | Status: DC
  Filled 2022-11-06: qty 135, 90d supply, fill #0
  Filled 2022-11-14: qty 45, 30d supply, fill #0
  Filled 2022-12-18: qty 45, 30d supply, fill #1
  Filled 2023-01-16: qty 45, 30d supply, fill #2
  Filled 2023-02-17: qty 45, 30d supply, fill #3
  Filled 2023-03-18: qty 45, 30d supply, fill #4
  Filled 2023-04-15: qty 45, 30d supply, fill #5
  Filled 2023-05-20: qty 45, 30d supply, fill #6
  Filled 2023-06-16: qty 45, 30d supply, fill #7
  Filled 2023-07-16: qty 45, 30d supply, fill #8
  Filled 2023-08-14: qty 45, 30d supply, fill #9
  Filled 2023-09-11: qty 45, 30d supply, fill #10
  Filled 2023-10-16: qty 45, 30d supply, fill #11

## 2022-11-06 MED ORDER — PRAMIPEXOLE DIHYDROCHLORIDE 1 MG PO TABS
1.0000 mg | ORAL_TABLET | Freq: Every day | ORAL | 3 refills | Status: DC
  Filled 2022-11-06: qty 90, 90d supply, fill #0
  Filled 2022-11-29: qty 30, 30d supply, fill #0
  Filled 2022-12-31: qty 30, 30d supply, fill #1
  Filled 2023-01-31: qty 30, 30d supply, fill #2
  Filled 2023-03-04: qty 30, 30d supply, fill #3
  Filled 2023-04-02: qty 30, 30d supply, fill #4
  Filled 2023-05-03: qty 30, 30d supply, fill #5
  Filled 2023-06-02: qty 30, 30d supply, fill #6
  Filled 2023-07-02: qty 30, 30d supply, fill #7
  Filled 2023-08-01: qty 30, 30d supply, fill #8
  Filled 2023-08-31: qty 30, 30d supply, fill #9
  Filled 2023-10-01: qty 30, 30d supply, fill #10
  Filled 2023-10-31: qty 30, 30d supply, fill #11

## 2022-11-06 NOTE — Progress Notes (Signed)
   Subjective:    Patient ID: Elizabeth Foster, female    DOB: [birth year], 36 y.o.   MRN: [id number]  HPI Patient arrives today for medication check. Patient states no concerns or issues today. .phm  Outpatient Encounter Medications as of 11/06/2022  Medication Sig   acetaminophen (TYLENOL) 500 MG tablet Take 2 tablets bid prn   Calcium-Vitamin D-Vitamin K (VIACTIV CALCIUM PLUS D) 650-12.5-40 MG-MCG-MCG CHEW 3 chews daily   ferrous sulfate 325 (65 FE) MG EC tablet Take 1 tablet (325 mg total) by mouth daily with breakfast   fexofenadine (ALLEGRA ALLERGY) 180 MG tablet Take 1 tablet (180 mg total) by mouth daily.   Multiple Vitamin (MULTIVITAMIN) capsule PRO CARE BARIATRIC MULTIVITAMIN WITH 45 MG IRON DAILY   Omega-3 Fatty Acids (FISH OIL) 1200 MG CAPS Take 1,200 mg by mouth in the morning and at bedtime.   omeprazole (PRILOSEC) 20 MG capsule Take 1 capsule (20 mg total) by mouth 2 (two) times daily before a meal.   sertraline (ZOLOFT) 25 MG tablet Take 1 tablet by mouth with 50 mg to add up to 75 mg daily APPT REQUIRED   sertraline (ZOLOFT) 50 MG tablet Take 1 tablet by mouth once daily with 25 mg to make up 75 mg APPT REQUIRED   VITAMIN D PO Take 5,000 Units by mouth daily.   [DISCONTINUED] pramipexole (MIRAPEX) 1 MG tablet Take 1 tablet (1 mg total) by mouth at bedtime.   [DISCONTINUED] sertraline (ZOLOFT) 50 MG tablet Take 1.5 tablets (75 mg total) by mouth daily.   polyethylene glycol (MIRALAX / GLYCOLAX) 17 g packet PRN   pramipexole (MIRAPEX) 1 MG tablet ONE DAILY   pramipexole (MIRAPEX) 1 MG tablet Take 1 tablet (1 mg total) by mouth at bedtime.   sertraline (ZOLOFT) 50 MG tablet Take 1.5 tablets (75 mg total) by mouth daily.   No facility-administered encounter medications on file as of 11/06/2022.    Elevated hemoglobin A1c - Plan: Hemoglobin A1c  Restless leg syndrome  Hyperlipidemia, unspecified hyperlipidemia type - Plan: Lipid panel  Morbid obesity (HCC), Chronic  Patient  does have restless leg syndrome she does use medication it does help. She also states her moods overall doing fairly well anxiety under good control Has hyperlipidemia as well as history of elevated A1c but she has gone through gastric sleeve and lost considerable amount of weight we will need to recheck labs Also has morbid obesity and has been losing weight   Review of Systems     Objective:   Physical Exam General-in no acute distress Eyes-no discharge Lungs-respiratory rate normal, CTA CV-no murmurs,RRR Extremities skin warm dry no edema Neuro grossly normal Behavior normal, alert        Assessment & Plan:  1. Elevated hemoglobin A1c She has had a gastric sleeve she is losing a lot of weight this should be normal now but we will go ahead and check a - Hemoglobin A1c  2. Restless leg syndrome She uses her medication on a regular basis it does help  3. Hyperlipidemia, unspecified hyperlipidemia type Check lipid healthy diet recommended - Lipid panel  Morbid obesity-her weight is coming down she is doing much better  Moods are overall doing well follow-up within 1 year follow-up sooner if any problems

## 2022-11-14 ENCOUNTER — Other Ambulatory Visit: Payer: Self-pay | Admitting: Internal Medicine

## 2022-11-14 ENCOUNTER — Other Ambulatory Visit (HOSPITAL_COMMUNITY): Payer: Self-pay

## 2022-11-14 ENCOUNTER — Other Ambulatory Visit: Payer: Self-pay

## 2022-11-14 MED ORDER — OMEPRAZOLE 20 MG PO CPDR
20.0000 mg | DELAYED_RELEASE_CAPSULE | Freq: Two times a day (BID) | ORAL | 5 refills | Status: DC
  Filled 2022-11-14: qty 60, 30d supply, fill #0
  Filled 2023-01-16: qty 60, 30d supply, fill #1
  Filled 2023-04-02: qty 60, 30d supply, fill #2
  Filled 2023-05-03: qty 60, 30d supply, fill #3
  Filled 2023-06-02: qty 60, 30d supply, fill #4
  Filled 2023-07-02: qty 60, 30d supply, fill #5

## 2022-11-29 ENCOUNTER — Other Ambulatory Visit (HOSPITAL_BASED_OUTPATIENT_CLINIC_OR_DEPARTMENT_OTHER): Payer: Self-pay

## 2022-11-29 ENCOUNTER — Other Ambulatory Visit: Payer: Self-pay

## 2022-12-06 ENCOUNTER — Other Ambulatory Visit: Payer: Self-pay

## 2022-12-06 ENCOUNTER — Other Ambulatory Visit (HOSPITAL_COMMUNITY): Payer: Self-pay

## 2022-12-06 MED ORDER — OMEPRAZOLE 40 MG PO CPDR
40.0000 mg | DELAYED_RELEASE_CAPSULE | Freq: Two times a day (BID) | ORAL | 11 refills | Status: DC
  Filled 2022-12-06: qty 60, 30d supply, fill #0
  Filled 2022-12-31: qty 60, 30d supply, fill #1
  Filled 2023-01-31: qty 60, 30d supply, fill #2
  Filled 2023-03-11: qty 60, 30d supply, fill #3
  Filled 2023-04-08: qty 60, 30d supply, fill #4
  Filled 2023-05-20: qty 60, 30d supply, fill #5
  Filled 2023-06-16: qty 60, 30d supply, fill #6
  Filled 2023-07-16: qty 60, 30d supply, fill #7
  Filled 2023-08-14: qty 60, 30d supply, fill #8
  Filled 2023-09-11: qty 60, 30d supply, fill #9
  Filled 2023-10-16: qty 60, 30d supply, fill #10

## 2022-12-06 MED ORDER — FERROUS SULFATE 325 (65 FE) MG PO TBEC
1.0000 | DELAYED_RELEASE_TABLET | Freq: Every day | ORAL | 11 refills | Status: DC
  Filled 2022-12-06 – 2023-07-02 (×2): qty 30, 30d supply, fill #0
  Filled 2023-08-01: qty 30, 30d supply, fill #1
  Filled 2023-08-31: qty 30, 30d supply, fill #2
  Filled 2023-10-01: qty 30, 30d supply, fill #3

## 2022-12-09 ENCOUNTER — Other Ambulatory Visit (HOSPITAL_COMMUNITY): Payer: Self-pay

## 2022-12-18 ENCOUNTER — Other Ambulatory Visit (HOSPITAL_COMMUNITY): Payer: Self-pay

## 2022-12-31 ENCOUNTER — Other Ambulatory Visit (HOSPITAL_COMMUNITY): Payer: Self-pay

## 2023-01-03 ENCOUNTER — Other Ambulatory Visit (HOSPITAL_COMMUNITY): Payer: Self-pay

## 2023-01-06 ENCOUNTER — Other Ambulatory Visit (HOSPITAL_COMMUNITY): Payer: Self-pay

## 2023-01-17 ENCOUNTER — Other Ambulatory Visit (HOSPITAL_COMMUNITY): Payer: Self-pay

## 2023-02-01 ENCOUNTER — Other Ambulatory Visit (HOSPITAL_COMMUNITY): Payer: Self-pay

## 2023-02-17 ENCOUNTER — Other Ambulatory Visit: Payer: Self-pay

## 2023-03-04 ENCOUNTER — Other Ambulatory Visit (HOSPITAL_COMMUNITY): Payer: Self-pay

## 2023-03-11 ENCOUNTER — Other Ambulatory Visit: Payer: Self-pay

## 2023-03-12 ENCOUNTER — Other Ambulatory Visit: Payer: Self-pay

## 2023-03-12 ENCOUNTER — Encounter: Payer: Self-pay | Admitting: Family Medicine

## 2023-03-12 DIAGNOSIS — Z23 Encounter for immunization: Secondary | ICD-10-CM

## 2023-03-18 ENCOUNTER — Other Ambulatory Visit (HOSPITAL_COMMUNITY): Payer: Self-pay

## 2023-04-02 ENCOUNTER — Other Ambulatory Visit (HOSPITAL_COMMUNITY): Payer: Self-pay

## 2023-04-08 ENCOUNTER — Other Ambulatory Visit (HOSPITAL_COMMUNITY): Payer: Self-pay

## 2023-04-10 ENCOUNTER — Ambulatory Visit: Payer: Managed Care, Other (non HMO) | Admitting: Dietician

## 2023-04-10 ENCOUNTER — Ambulatory Visit: Payer: Managed Care, Other (non HMO) | Admitting: Skilled Nursing Facility1

## 2023-04-15 ENCOUNTER — Other Ambulatory Visit (HOSPITAL_COMMUNITY): Payer: Self-pay

## 2023-04-25 ENCOUNTER — Encounter: Payer: Managed Care, Other (non HMO) | Attending: General Surgery | Admitting: Dietician

## 2023-04-25 ENCOUNTER — Encounter: Payer: Self-pay | Admitting: Dietician

## 2023-04-25 VITALS — Ht 64.0 in | Wt 225.8 lb

## 2023-04-25 DIAGNOSIS — E669 Obesity, unspecified: Secondary | ICD-10-CM | POA: Insufficient documentation

## 2023-04-25 NOTE — Progress Notes (Signed)
Bariatric Nutrition Follow-Up Visit Medical Nutrition Therapy   Surgery date: 04/08/2022 Surgery type: sleeve  Anthropometrics  Start weight at [facility]: 290.1 lbs (date: 02/07/2022)  Height: 64 in Weight today:    Clinical  Medical hx: IBS, GERD, obesity, high cholesterol, HTN, anxiety Medications: Omeprazole, Zoloft, multivitamin, fish oil, Mirapex vit d, Allegra, [id number]  Labs: Iron saturation 8; now taking an iron supplement 325 mg Notable signs/symptoms: none noted Any previous deficiencies? No Bowel Habits: Every day to every other day no complaints   Body Composition Scale 04/23/2022 06/05/2022 09/26/2022 04/25/2023  Current Body Weight 271.7 255.1 241.1 225.8  Total Body Fat % 46.5 45.1 43.8 42.0  Visceral Fat 15 14 13 12   Fat-Free Mass % 53.4 54.8 56.1 57.9   Total Body Water % 41.2 41.9 42.5 43.4  Muscle-Mass lbs 32.9 32.7 32.4 32.3  BMI 46.6 43.7 41.3 38.6  Body Fat Displacement             Torso  lbs 78.5 71.3 65.4 58.8         Left Leg  lbs 15.7 14.2 13 11.7         Right Leg  lbs 15.7 14.2 13 11.7         Left Arm  lbs 7.8 7.1 6.5 5.8         Right Arm   lbs 7.8 7.1 6.5 5.8    Lifestyle & Dietary Hx  Pt arrived with her 3 children. Pt states she took her first hike in a long time, [place] Pt states she is getting up earlier and can start drinking fluids. Pt states she takes additional iron, sating it was low. Pt states she takes her multivitamin with iron and additional iron. Pt states she feels better, with more energy. Pt states beef filet does not go well. Pt states she avoids steaks, but can do ground beef (taco meat) Pt planning to get a walking pad for the winter months. Pt is doing great. Pt states she would like to continue to follow-up to be able to check-in.  Estimated daily fluid intake: 80+ oz Estimated daily protein intake: 70-90 g Supplements: multivitamin and calcium  Current average weekly physical activity:  walking trying to hit 10,000 steps (some days 30 minutes walking or active outside  24-Hr Dietary Recall First Meal: scrambled eggs and fried bologna or yogrut + granola and strawberry or protein shake with apple and peanut butter on busy days Snack: cheese sticks  Second Meal: lightly bread chicken bites and green beans Snack: greek yogurt  Third Meal: protein shake or salad or tuna or clams or oysters or chicken or other fish (grilled flounder) or salad sometimes Snack:  Beverages: water, water with flavor packets, plain water, Gatorade zero, tea packets with water, protein shakes, coffee or tea  Post-Op Goals/ Signs/ Symptoms Using straws: yes Drinking while eating: no Chewing/swallowing difficulties: no Changes in vision: no Changes to mood/headaches: no Hair loss/changes to skin/nails: some Difficulty focusing/concentrating: no Sweating: no Limb weakness: no Dizziness/lightheadedness: no Palpitations: no Carbonated/caffeinated beverages: no N/V/D/C/Gas: no Abdominal pain: no Dumping syndrome: no   NUTRITION DIAGNOSIS  Overweight/obesity ([place]-3.3) related to past poor dietary habits and physical inactivity as evidenced by completed bariatric surgery and following dietary guidelines for continued weight loss and healthy nutrition status.    NUTRITION INTERVENTION Nutrition counseling (C-1) and education (E-2) to facilitate bariatric surgery goals, including: The importance of consuming adequate calories as well as certain  nutrients daily due to the body's need for essential vitamins, minerals, and fats The importance of daily physical activity and to reach a goal of at least 150 minutes of moderate to vigorous physical activity weekly (or as directed by their physician) due to benefits such as increased musculature and improved lab values The importance of intuitive eating specifically learning hunger-satiety cues and understanding the importance of learning a new body: The  importance of mindful eating to avoid grazing behaviors  Discussed with pt how many fruits and vegetables to add in each day and the role of fiber in the diet.  Encouraged pt to increase water intake when possible, to compensate for increased fiber intake.  Why you need complex carbohydrates: Whole grains and other complex carbohydrates are required to have a healthy diet. Whole grains provide fiber which can help with blood glucose levels and help keep you satiated. Fruits and starchy vegetables provide essential vitamins and minerals required for immune function, eyesight support, brain support, bone density, wound healing and many other functions within the body. According to the current evidenced based 2020-2025 Dietary Guidelines for Americans, complex carbohydrates are part of a healthy eating pattern which is associated with a decreased risk for type 2 diabetes, cancers, and cardiovascular disease.   Handouts Previously Provided Include  Maintenance Plan (1 Year)  Learning Style & Readiness for Change Teaching method utilized: Visual & Auditory  Demonstrated degree of understanding via: Teach Back  Readiness Level: action Barriers to learning/adherence to lifestyle change: Nothing identified  RD's Notes for Next Visit Assess adherence to pt chosen goals  MONITORING & EVALUATION Dietary intake, weekly physical activity, body weight.  Next Steps Patient is to follow-up in 3 months.

## 2023-05-04 ENCOUNTER — Other Ambulatory Visit (HOSPITAL_COMMUNITY): Payer: Self-pay

## 2023-05-06 ENCOUNTER — Other Ambulatory Visit (HOSPITAL_COMMUNITY): Payer: Self-pay

## 2023-05-07 ENCOUNTER — Other Ambulatory Visit (HOSPITAL_COMMUNITY): Payer: Self-pay

## 2023-05-20 ENCOUNTER — Other Ambulatory Visit: Payer: Self-pay

## 2023-06-03 ENCOUNTER — Other Ambulatory Visit: Payer: Self-pay

## 2023-06-17 ENCOUNTER — Other Ambulatory Visit: Payer: Self-pay

## 2023-07-02 ENCOUNTER — Telehealth: Payer: Self-pay

## 2023-07-02 NOTE — Telephone Encounter (Signed)
Reason for CRM: Patient wants to know if can be called in Tamiflu for Flu symptoms her daughter has is it and know she will soon as well. Wants to know if to come in for appointment or can it be send to the pharmacy without an appointment.

## 2023-07-03 ENCOUNTER — Other Ambulatory Visit: Payer: Self-pay

## 2023-07-03 NOTE — Telephone Encounter (Signed)
Nurses-unfortunately we do not send them pre illness Tamiflu.  If she does get sick with the symptoms we would recommend preferably a visit or virtual visit or at least talking with [place]

## 2023-07-04 NOTE — Telephone Encounter (Signed)
Nurses Seem to indicate prophylactic care thus my reply at that time  Certainly sympathetic to what is going on with Elizabeth Foster- It is possible that she may well have a case of the flu.  At this point I would consider that outside the window for effective treatment with Tamiflu Most cases gradually get better over several days with supportive measures Important to watch for any signs of secondary infection including prolonged or worsening illness  Or return of fevers after fever being gone for a couple days (in other words a person feel sick starts to improve for couple days then suddenly gets a lot worse) With any of those scenarios I would recommend being checked out either through provider at the office or urgent care  I am out of the office the tail end of this week Thursday and Friday-I will not be checking messages any further.  Please have one of the providers who is at the office handled this if needing further input or utilizing urgent care thank you

## 2023-07-16 ENCOUNTER — Other Ambulatory Visit (HOSPITAL_COMMUNITY): Payer: Self-pay

## 2023-07-25 ENCOUNTER — Ambulatory Visit: Payer: Managed Care, Other (non HMO) | Admitting: Dietician

## 2023-08-01 ENCOUNTER — Other Ambulatory Visit: Payer: Self-pay

## 2023-08-01 ENCOUNTER — Other Ambulatory Visit (HOSPITAL_COMMUNITY): Payer: Self-pay

## 2023-08-02 ENCOUNTER — Other Ambulatory Visit (HOSPITAL_COMMUNITY): Payer: Self-pay

## 2023-08-14 ENCOUNTER — Other Ambulatory Visit: Payer: Self-pay

## 2023-09-01 ENCOUNTER — Other Ambulatory Visit (HOSPITAL_COMMUNITY): Payer: Self-pay

## 2023-09-11 ENCOUNTER — Other Ambulatory Visit (HOSPITAL_COMMUNITY): Payer: Self-pay

## 2023-10-01 ENCOUNTER — Other Ambulatory Visit (HOSPITAL_COMMUNITY): Payer: Self-pay

## 2023-10-16 ENCOUNTER — Other Ambulatory Visit (HOSPITAL_COMMUNITY): Payer: Self-pay

## 2023-10-31 ENCOUNTER — Other Ambulatory Visit: Payer: Self-pay

## 2023-11-05 ENCOUNTER — Encounter: Payer: Self-pay | Admitting: Physician Assistant

## 2023-11-05 ENCOUNTER — Ambulatory Visit: Admitting: Physician Assistant

## 2023-11-05 VITALS — BP 118/76 | HR 74 | Temp 97.6°F | Ht 64.0 in | Wt 231.0 lb

## 2023-11-05 DIAGNOSIS — T63301A Toxic effect of unspecified spider venom, accidental (unintentional), initial encounter: Secondary | ICD-10-CM | POA: Diagnosis not present

## 2023-11-05 MED ORDER — PRAMIPEXOLE DIHYDROCHLORIDE 1 MG PO TABS: 1.0000 mg | ORAL_TABLET | Freq: Every day | ORAL | 3 refills | Status: AC

## 2023-11-05 MED ORDER — SERTRALINE HCL 50 MG PO TABS: 75.0000 mg | ORAL_TABLET | Freq: Every day | ORAL | 3 refills | Status: AC

## 2023-11-05 NOTE — Progress Notes (Signed)
   Acute Office Visit  Subjective:     Patient ID: Elizabeth Foster, female    DOB: January 09, 1986, 38 y.o.   MRN: [id number]   Patient presents today with concerns following a spider bite. She reports last night around 9:45 she was in her barn and got bite by a spider that appeared to be a female black widow. She provided pictures of spider and her bite. Bite occurred on left hand, symptoms included pain and redness, however symptoms have since resolved. She denies muscle rigidity, paresthesias, tremor, or palpitations. There is no evidence of a bite or wound on her hand today.      Review of Systems  Constitutional:  Negative for chills and fever.  Cardiovascular:  Negative for chest pain and palpitations.  Gastrointestinal:  Negative for nausea and vomiting.  Musculoskeletal:  Negative for myalgias.  Skin:  Negative for itching and rash.  Neurological:  Negative for tingling and tremors.        Objective:     BP 118/76   Pulse 74   Temp 97.6 F (36.4 C)   Ht 5\' 4"  (1.626 m)   Wt 231 lb (104.8 kg)   SpO2 98%   BMI 39.65 kg/m   Physical Exam Constitutional:      Appearance: Normal appearance. She is obese.  HENT:     Head: Normocephalic and atraumatic.     Nose: Nose normal.     Mouth/Throat:     Mouth: Mucous membranes are moist.     Pharynx: Oropharynx is clear.  Cardiovascular:     Rate and Rhythm: Normal rate and regular rhythm.     Heart sounds: Normal heart sounds. No murmur heard. Pulmonary:     Effort: Pulmonary effort is normal.     Breath sounds: Normal breath sounds.  Skin:    General: Skin is warm and dry.     Findings: No erythema, lesion or rash.  Neurological:     General: No focal deficit present.     Mental Status: She is alert.     Sensory: No sensory deficit.     Motor: No weakness.     No results found for any visits on 11/05/23.      Assessment & Plan:  Spider bite wound, accidental or unintentional, initial encounter Assessment &  Plan: Patient presents today following a spider bite on her left hand last night. No evidence of spider bite today. No erythema or swelling of left hand. Patient is not experiencing muscle rigidity, palpitations, or paresthesias today. Reassurance given. Ibuprofen /tylenol  as needed for pain. Warning signs and return precautions discussed.    Other orders -     Pramipexole  Dihydrochloride; Take 1 tablet (1 mg total) by mouth at bedtime.  Dispense: 90 tablet; Refill: 3 -     Sertraline  HCl; Take 1.5 tablets (75 mg total) by mouth daily.  Dispense: 135 tablet; Refill: 3   Return if symptoms worsen or fail to improve.  Jearlean Mince Kadelyn Dimascio, PA-C

## 2023-11-05 NOTE — Assessment & Plan Note (Signed)
 Patient presents today following a spider bite on her left hand last night. No evidence of spider bite today. No erythema or swelling of left hand. Patient is not experiencing muscle rigidity, palpitations, or paresthesias today. Reassurance given. Ibuprofen /tylenol  as needed for pain. Warning signs and return precautions discussed.

## 2023-11-13 ENCOUNTER — Encounter (HOSPITAL_COMMUNITY): Payer: Self-pay | Admitting: *Deleted

## 2024-02-16 ENCOUNTER — Other Ambulatory Visit (HOSPITAL_COMMUNITY): Payer: Self-pay

## 2024-02-20 ENCOUNTER — Other Ambulatory Visit (HOSPITAL_COMMUNITY): Payer: Self-pay

## 2024-02-20 MED ORDER — OMEPRAZOLE 40 MG PO CPDR
40.0000 mg | DELAYED_RELEASE_CAPSULE | Freq: Two times a day (BID) | ORAL | 11 refills | Status: AC
  Filled 2024-02-20: qty 60, 30d supply, fill #0
  Filled 2024-03-26: qty 60, 30d supply, fill #1
  Filled 2024-04-27: qty 60, 30d supply, fill #2
  Filled 2024-05-31: qty 60, 30d supply, fill #3
  Filled 2024-06-26 – 2024-07-02 (×2): qty 60, 30d supply, fill #4

## 2024-02-20 MED ORDER — FERROUS SULFATE 325 (65 FE) MG PO TBEC
325.0000 mg | DELAYED_RELEASE_TABLET | Freq: Every day | ORAL | 11 refills | Status: AC
  Filled 2024-02-20: qty 30, 30d supply, fill #0
  Filled 2024-03-26: qty 30, 30d supply, fill #1
  Filled 2024-04-27: qty 30, 30d supply, fill #2
  Filled 2024-05-31: qty 30, 30d supply, fill #3
  Filled 2024-06-26 – 2024-07-02 (×2): qty 30, 30d supply, fill #4

## 2024-03-26 ENCOUNTER — Other Ambulatory Visit: Payer: Self-pay

## 2024-04-27 ENCOUNTER — Other Ambulatory Visit (HOSPITAL_COMMUNITY): Payer: Self-pay

## 2024-05-31 ENCOUNTER — Other Ambulatory Visit: Payer: Self-pay

## 2024-06-20 ENCOUNTER — Telehealth: Admitting: Emergency Medicine

## 2024-06-20 DIAGNOSIS — B9689 Other specified bacterial agents as the cause of diseases classified elsewhere: Secondary | ICD-10-CM

## 2024-06-20 DIAGNOSIS — J069 Acute upper respiratory infection, unspecified: Secondary | ICD-10-CM

## 2024-06-22 MED ORDER — DOXYCYCLINE HYCLATE 100 MG PO TABS: 100.0000 mg | ORAL_TABLET | Freq: Two times a day (BID) | ORAL | 0 refills | Status: DC

## 2024-06-22 MED ORDER — CEFIXIME 400 MG PO CAPS: 400.0000 mg | ORAL_CAPSULE | Freq: Every day | ORAL | 0 refills | Status: AC

## 2024-06-22 NOTE — Progress Notes (Signed)
 E-Visit for Sinus Problems  We are sorry that you are not feeling well.  Here is how we plan to help!  Based on what you have shared with me it looks like you have sinusitis.  Sinusitis is inflammation and infection in the sinus cavities of the head.  Based on your presentation I believe you most likely have Acute Bacterial Sinusitis.  This is an infection caused by bacteria and is treated with antibiotics. I have prescribed Doxycycline 100mg  by mouth twice a day for 7 days. You may use an oral decongestant such as Mucinex D or if you have glaucoma or high blood pressure use plain Mucinex. Saline nasal spray help and can safely be used as often as needed for congestion.  If you develop worsening sinus pain, fever or notice severe headache and vision changes, or if symptoms are not better after completion of antibiotic, please schedule an appointment with a health care provider.    Sinus infections are not as easily transmitted as other respiratory infection, however we still recommend that you avoid close contact with loved ones, especially the very young and elderly.  Remember to wash your hands thoroughly throughout the day as this is the number one way to prevent the spread of infection!  Home Care: Only take medications as instructed by your medical team. Complete the entire course of an antibiotic. Do not take these medications with alcohol. A steam or ultrasonic humidifier can help congestion.  You can place a towel over your head and breathe in the steam from hot water coming from a faucet. Avoid close contacts especially the very young and the elderly. Cover your mouth when you cough or sneeze. Always remember to wash your hands.  Get Help Right Away If: You develop worsening fever or sinus pain. You develop a severe head ache or visual changes. Your symptoms persist after you have completed your treatment plan.  Make sure you Understand these instructions. Will watch your  condition. Will get help right away if you are not doing well or get worse.  Your e-visit answers were reviewed by a board certified advanced clinical practitioner to complete your personal care plan.  Depending on the condition, your plan could have included both over the counter or prescription medications.  If there is a problem please reply  once you have received a response from your provider.  Your safety is important to us .  If you have drug allergies check your prescription carefully.    You can use MyChart to ask questions about today's visit, request a non-urgent call back, or ask for a work or school excuse for 24 hours related to this e-Visit. If it has been greater than 24 hours you will need to follow up with your provider, or enter a new e-Visit to address those concerns.  You will get an e-mail in the next two days asking about your experience.  I hope that your e-visit has been valuable and will speed your recovery. Thank you for using e-visits.  I have spent 5 minutes in review of e-visit questionnaire, review and updating patient chart, medical decision making and response to patient.   Delon CHRISTELLA Dickinson, PA-C

## 2024-06-22 NOTE — Addendum Note (Signed)
 Addended by: Danecia Underdown M on: 06/22/2024 10:07 AM   Modules accepted: Orders

## 2024-06-23 ENCOUNTER — Ambulatory Visit: Payer: Self-pay | Admitting: Family Medicine

## 2024-06-27 ENCOUNTER — Other Ambulatory Visit: Payer: Self-pay

## 2024-07-02 ENCOUNTER — Other Ambulatory Visit (HOSPITAL_COMMUNITY): Payer: Self-pay
